# Patient Record
Sex: Male | Born: 1940
Health system: Southern US, Community
[De-identification: ages and names within clinical notes are randomized; demographics above are authoritative.]

## PROBLEM LIST (undated history)

## (undated) DIAGNOSIS — K635 Polyp of colon: Secondary | ICD-10-CM

## (undated) DIAGNOSIS — H269 Unspecified cataract: Secondary | ICD-10-CM

## (undated) DIAGNOSIS — E875 Hyperkalemia: Secondary | ICD-10-CM

## (undated) DIAGNOSIS — E785 Hyperlipidemia, unspecified: Secondary | ICD-10-CM

## (undated) DIAGNOSIS — H9312 Tinnitus, left ear: Secondary | ICD-10-CM

## (undated) DIAGNOSIS — I10 Essential (primary) hypertension: Secondary | ICD-10-CM

## (undated) DIAGNOSIS — H903 Sensorineural hearing loss, bilateral: Secondary | ICD-10-CM

## (undated) DIAGNOSIS — H409 Unspecified glaucoma: Secondary | ICD-10-CM

## (undated) DIAGNOSIS — C4490 Unspecified malignant neoplasm of skin, unspecified: Secondary | ICD-10-CM

## (undated) DIAGNOSIS — R972 Elevated prostate specific antigen [PSA]: Secondary | ICD-10-CM

## (undated) HISTORY — DX: Hyperkalemia: E87.5

## (undated) HISTORY — DX: Unspecified cataract: H26.9

## (undated) HISTORY — DX: Sensorineural hearing loss, bilateral: H90.3

## (undated) HISTORY — PX: COLONOSCOPY: SHX174

## (undated) HISTORY — DX: Unspecified glaucoma: H40.9

## (undated) HISTORY — DX: Essential (primary) hypertension: I10

## (undated) HISTORY — DX: Unspecified malignant neoplasm of skin, unspecified: C44.90

## (undated) HISTORY — DX: Hyperlipidemia, unspecified: E78.5

## (undated) HISTORY — DX: Polyp of colon: K63.5

## (undated) HISTORY — PX: TONSILLECTOMY: SUR1361

## (undated) HISTORY — DX: Elevated prostate specific antigen (PSA): R97.20

## (undated) HISTORY — DX: Tinnitus, left ear: H93.12

---

## 1999-11-25 HISTORY — PX: INGUINAL HERNIA REPAIR: SUR1180

## 2007-11-25 DIAGNOSIS — C4490 Unspecified malignant neoplasm of skin, unspecified: Secondary | ICD-10-CM

## 2007-11-25 HISTORY — DX: Unspecified malignant neoplasm of skin, unspecified: C44.90

## 2009-06-24 HISTORY — PX: COLONOSCOPY W/ POLYPECTOMY: SHX1380

## 2013-10-31 ENCOUNTER — Ambulatory Visit (INDEPENDENT_AMBULATORY_CARE_PROVIDER_SITE_OTHER): Payer: BC Managed Care – PPO | Admitting: Family Medicine

## 2013-10-31 ENCOUNTER — Encounter: Payer: Self-pay | Admitting: Family Medicine

## 2013-10-31 VITALS — BP 158/78 | HR 64 | Temp 98.0°F | Resp 18 | Ht 69.5 in | Wt 177.0 lb

## 2013-10-31 DIAGNOSIS — I10 Essential (primary) hypertension: Secondary | ICD-10-CM

## 2013-10-31 DIAGNOSIS — Z125 Encounter for screening for malignant neoplasm of prostate: Secondary | ICD-10-CM

## 2013-10-31 DIAGNOSIS — E785 Hyperlipidemia, unspecified: Secondary | ICD-10-CM

## 2013-10-31 NOTE — Assessment & Plan Note (Signed)
Most recent lipid panel reviewed from 08/2013 and this was excellent/at goal. We can repeat these in 1 yr.

## 2013-10-31 NOTE — Progress Notes (Signed)
Office Note Pre visit review using our clinic review tool, if applicable. No additional management support is needed unless otherwise documented below in the visit note.  10/31/2013  CC:  Chief Complaint  Patient presents with  . Establish Care  . Hypertension    HPI:  Curtis Matthews is a 72 y.o. White male who is here to establish care. Patient's most recent primary MD: Dr. Christell Constant at [facility]. Old records were (labs from 09/02/12 and 02/2013) reviewed prior to or during today's visit.  Says systolic consistently 140-150s lately per his home checks.  No change in caffeine intake (3 cups/day), no OTC decongestants.  Wt is up a little and exercise down a little lately ([employer]). He plans on working harder on TLC in the coming months b/c he wants to avoid having to add to/change bp med regimen.   Denies HAs, vision changes, palpitations, SOB, or CP.  Past Medical History  Diagnosis Date  . Nonmelanoma skin cancer 2009    sees skin MD annually  . Hyperlipidemia   . Hypertension   . Colon polyps     Past Surgical History  Procedure Laterality Date  . Inguinal hernia repair Left 2001  . Tonsillectomy    . Colonoscopy w/ polypectomy  06/2009    adenomatous polyps; recall 06/2014 ([facility] [place]    Family History  Problem Relation Age of Onset  . Heart disease Mother   . Hypertension Mother     History   Social History  . Marital Status: Married    Spouse Name: N/A    Number of Children: N/A  . Years of Education: N/A   Occupational History  . Not on file.   Social History Main Topics  . Smoking status: Never Smoker   . Smokeless tobacco: Never Used  . Alcohol Use: 0.6 oz/week    1 Glasses of wine per week     Comment: glass of wine daily  . Drug Use: No  . Sexual Activity: Not on file   Other Topics Concern  . Not on file   Social History Narrative   Married, has one adopted son.   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   Never smoker, alcohol--1-2 glasses of wine per day.     No hx of alc abuse or drug use.    Outpatient Encounter Prescriptions as of 10/31/2013  Medication Sig  . aspirin 81 MG tablet Take 81 mg by mouth daily.  . cholecalciferol (VITAMIN D) 1000 UNITS tablet Take 1,000 Units by mouth daily.  . CRESTOR 5 MG tablet Take 5 mg by mouth daily.  Marland Kitchen lisinopril (PRINIVIL,ZESTRIL) 10 MG tablet Take 10 mg by mouth daily.  . Omega-3 Fatty Acids (FISH OIL) 1000 MG CAPS Take by mouth.  . verapamil (VERELAN PM) 240 MG 24 hr capsule Take 240 mg by mouth daily.    No Known Allergies  ROS Review of Systems  Constitutional: Negative for fever and fatigue.  HENT: Negative for congestion and sore throat.   Eyes: Negative for visual disturbance.  Respiratory: Negative for cough.   Cardiovascular: Negative for chest pain.  Gastrointestinal: Negative for nausea and abdominal pain.  Genitourinary: Negative for dysuria.  Musculoskeletal: Negative for back pain and joint swelling.  Skin: Negative for rash.  Neurological: Negative for weakness and headaches.  Hematological: Negative for adenopathy.     PE; Blood pressure 158/78, pulse 64, temperature 98 F (36.7 C), temperature  source Temporal, resp. rate 18, height 5' 9.5" (1.765 m), weight 177 lb (80.287 kg), SpO2 99.00%. Gen: Alert, well appearing.  Patient is oriented to person, place, time, and situation. WUJ:WJXB: no injection, icteris, swelling, or exudate.  EOMI, PERRLA. Mouth: lips without lesion/swelling.  Oral mucosa pink and moist. Oropharynx without erythema, exudate, or swelling.  Neck - No masses or thyromegaly or limitation in range of motion CV: RRR, no m/r/g.   LUNGS: CTA bilat, nonlabored resps, good aeration in all lung fields. EXT: no clubbing, cyanosis, or edema.   Pertinent labs:  None today  08/2013 labs brought in by pt today showed a normal CMET, lipid panel, and UA.   Also PSA 02/2013 was  1.1.  ASSESSMENT AND PLAN:   New pt: obtain old records.  HTN (hypertension), benign Discussed close home monitoring and DASH diet. No med changes made today. We'll review his numbers at his CPE in 3 mo.  Hyperlipidemia Most recent lipid panel reviewed from 08/2013 and this was excellent/at goal. We can repeat these in 1 yr.   An After Visit Summary was printed and given to the patient.  Return in about 3 months (around 01/29/2014) for CPE with fasting labs the week prior.

## 2013-10-31 NOTE — Assessment & Plan Note (Signed)
Discussed close home monitoring and DASH diet. No med changes made today. We'll review his numbers at his CPE in 3 mo.

## 2014-02-08 ENCOUNTER — Telehealth: Payer: Self-pay

## 2014-02-08 ENCOUNTER — Other Ambulatory Visit (INDEPENDENT_AMBULATORY_CARE_PROVIDER_SITE_OTHER): Payer: BC Managed Care – PPO

## 2014-02-08 DIAGNOSIS — Z0000 Encounter for general adult medical examination without abnormal findings: Secondary | ICD-10-CM

## 2014-02-08 NOTE — Telephone Encounter (Signed)
Pt came in for labs today, he has a CPE next week. I know your health maintenance panel but did you want to add a PSA?

## 2014-02-08 NOTE — Telephone Encounter (Signed)
Yes, pls add medicare PSA.-thx

## 2014-02-10 LAB — LIPID PANEL
Cholesterol: 176 mg/dL (ref 0–200)
HDL: 65 mg/dL (ref >39.00)
LDL Cholesterol: 97 mg/dL (ref 0–99)
Total CHOL/HDL Ratio: 3
Triglycerides: 71 mg/dL (ref 0.0–149.0)
VLDL: 14.2 mg/dL (ref 0.0–40.0)

## 2014-02-10 LAB — CBC WITH DIFFERENTIAL/PLATELET
Basophils Absolute: 0 10*3/uL (ref 0.0–0.1)
Basophils Relative: 0.5 % (ref 0.0–3.0)
Eosinophils Absolute: 0.5 10*3/uL (ref 0.0–0.7)
Eosinophils Relative: 11.1 % — ABNORMAL HIGH (ref 0.0–5.0)
HCT: 41.9 % (ref 39.0–52.0)
Hemoglobin: 14.1 g/dL (ref 13.0–17.0)
Lymphocytes Relative: 38 % (ref 12.0–46.0)
Lymphs Abs: 1.8 10*3/uL (ref 0.7–4.0)
MCHC: 33.6 g/dL (ref 30.0–36.0)
MCV: 95.7 fl (ref 78.0–100.0)
Monocytes Absolute: 0.5 10*3/uL (ref 0.1–1.0)
Monocytes Relative: 11.2 % (ref 3.0–12.0)
Neutro Abs: 1.8 10*3/uL (ref 1.4–7.7)
Neutrophils Relative %: 39.2 % — ABNORMAL LOW (ref 43.0–77.0)
Platelets: 206 10*3/uL (ref 150.0–400.0)
RBC: 4.38 Mil/uL (ref 4.22–5.81)
RDW: 13.6 % (ref 11.5–14.6)
WBC: 4.6 10*3/uL (ref 4.5–10.5)

## 2014-02-10 LAB — TSH: TSH: 2.02 u[IU]/mL (ref 0.35–5.50)

## 2014-02-10 LAB — COMPREHENSIVE METABOLIC PANEL
ALT: 17 U/L (ref 0–53)
AST: 17 U/L (ref 0–37)
Albumin: 3.9 g/dL (ref 3.5–5.2)
Alkaline Phosphatase: 57 U/L (ref 39–117)
BUN: 24 mg/dL — ABNORMAL HIGH (ref 6–23)
CO2: 31 mEq/L (ref 19–32)
Calcium: 9.2 mg/dL (ref 8.4–10.5)
Chloride: 104 mEq/L (ref 96–112)
Creatinine, Ser: 1 mg/dL (ref 0.4–1.5)
GFR: 75.77 mL/min (ref >60.00)
Glucose, Bld: 107 mg/dL — ABNORMAL HIGH (ref 70–99)
Potassium: 5 mEq/L (ref 3.5–5.1)
Sodium: 138 mEq/L (ref 135–145)
Total Bilirubin: 0.7 mg/dL (ref 0.3–1.2)
Total Protein: 6.2 g/dL (ref 6.0–8.3)

## 2014-02-10 LAB — PSA, MEDICARE: PSA: 1.36 ng/ml (ref 0.10–4.00)

## 2014-02-15 ENCOUNTER — Encounter: Payer: Self-pay | Admitting: Family Medicine

## 2014-02-15 ENCOUNTER — Ambulatory Visit (INDEPENDENT_AMBULATORY_CARE_PROVIDER_SITE_OTHER): Payer: BC Managed Care – PPO | Admitting: Family Medicine

## 2014-02-15 VITALS — BP 148/78 | HR 70 | Temp 98.2°F | Resp 16 | Ht 69.5 in | Wt 177.0 lb

## 2014-02-15 DIAGNOSIS — E785 Hyperlipidemia, unspecified: Secondary | ICD-10-CM

## 2014-02-15 DIAGNOSIS — D126 Benign neoplasm of colon, unspecified: Secondary | ICD-10-CM

## 2014-02-15 DIAGNOSIS — I10 Essential (primary) hypertension: Secondary | ICD-10-CM

## 2014-02-15 DIAGNOSIS — Z125 Encounter for screening for malignant neoplasm of prostate: Secondary | ICD-10-CM

## 2014-02-15 DIAGNOSIS — Z23 Encounter for immunization: Secondary | ICD-10-CM

## 2014-02-15 DIAGNOSIS — Z0000 Encounter for general adult medical examination without abnormal findings: Secondary | ICD-10-CM

## 2014-02-15 DIAGNOSIS — K635 Polyp of colon: Secondary | ICD-10-CM | POA: Insufficient documentation

## 2014-02-15 MED ORDER — ROSUVASTATIN CALCIUM 5 MG PO TABS: 5.0000 mg | ORAL_TABLET | Freq: Every day | ORAL | Status: DC

## 2014-02-15 MED ORDER — LISINOPRIL 10 MG PO TABS: 10.0000 mg | ORAL_TABLET | Freq: Every day | ORAL | Status: DC

## 2014-02-15 MED ORDER — VERAPAMIL HCL ER 240 MG PO CP24: 240.0000 mg | ORAL_CAPSULE | Freq: Every day | ORAL | Status: DC

## 2014-02-15 NOTE — Progress Notes (Signed)
Pre visit review using our clinic review tool, if applicable. No additional management support is needed unless otherwise documented below in the visit note. 

## 2014-02-15 NOTE — Assessment & Plan Note (Signed)
The current medical regimen is effective;  continue present plan and medications.  

## 2014-02-15 NOTE — Assessment & Plan Note (Signed)
Prevnar 13 today

## 2014-02-15 NOTE — Assessment & Plan Note (Signed)
The current medical regimen is effective;  continue present plan and medications. Continue home bp monitoring. Signs/symptoms to call or return for were reviewed and pt expressed understanding.

## 2014-02-15 NOTE — Assessment & Plan Note (Signed)
Due for repeat colonoscopy via [facility] approx 06/2014.

## 2014-02-15 NOTE — Assessment & Plan Note (Signed)
DRE normal today. Recent PSA was good.

## 2014-02-15 NOTE — Progress Notes (Signed)
Office Note 02/15/2014  CC:  Chief Complaint  Patient presents with  . Annual Exam    HPI:  Curtis Matthews is a 73 y.o. White male who is here for CPE, f/u HTN/hyperlipidemia.   BP checks at home mostly 140s over 70s.  Occ syst in 120s or a little less. Eye check-up recently--fine. Dental recently-fine.  No signif hearing c/o.  Reviewed recent labs in detail with pt today.  Only abnormality was glucose 108: discussed diet/exercise today.   Past Medical History  Diagnosis Date  . Nonmelanoma skin cancer 2009    sees skin MD annually  . Hyperlipidemia   . Hypertension   . Colon polyps     Past Surgical History  Procedure Laterality Date  . Inguinal hernia repair Left 2001  . Tonsillectomy    . Colonoscopy w/ polypectomy  06/2009    adenomatous polyps; recall 06/2014 ([facility] [place])    Family History  Problem Relation Age of Onset  . Heart disease Mother   . Hypertension Mother     History   Social History  . Marital Status: Married    Spouse Name: N/A    Number of Children: N/A  . Years of Education: N/A   Occupational History  . Not on file.   Social History Main Topics  . Smoking status: Never Smoker   . Smokeless tobacco: Never Used  . Alcohol Use: 0.6 oz/week    1 Glasses of wine per week     Comment: glass of wine daily  . Drug Use: No  . Sexual Activity: Not on file   Other Topics Concern  . Not on file   Social History Narrative   Married, has one adopted son.   Occupation:  Retired Chief Executive Officer for [employer].   Never smoker, alcohol--1-2 glasses of wine per day.     No hx of alc abuse or drug use.    Outpatient Prescriptions Prior to Visit  Medication Sig Dispense Refill  . aspirin 81 MG tablet Take 81 mg by mouth daily.      . cholecalciferol (VITAMIN D) 1000 UNITS tablet Take 1,000 Units by mouth daily.      . Omega-3 Fatty Acids (FISH OIL) 1000 MG CAPS Take by mouth.      . CRESTOR 5 MG tablet Take  5 mg by mouth daily.      Marland Kitchen lisinopril (PRINIVIL,ZESTRIL) 10 MG tablet Take 10 mg by mouth daily.      . verapamil (VERELAN PM) 240 MG 24 hr capsule Take 240 mg by mouth daily.       No facility-administered medications prior to visit.    No Known Allergies  ROS Review of Systems  Constitutional: Negative for fever, chills, appetite change and fatigue.  HENT: Negative for congestion, dental problem, ear pain and sore throat.   Eyes: Negative for discharge, redness and visual disturbance.  Respiratory: Negative for cough, chest tightness, shortness of breath and wheezing.   Cardiovascular: Negative for chest pain, palpitations and leg swelling.  Gastrointestinal: Negative for nausea, vomiting, abdominal pain, diarrhea and blood in stool.  Genitourinary: Negative for dysuria, urgency, frequency, hematuria, flank pain and difficulty urinating.  Musculoskeletal: Negative for arthralgias, back pain, joint swelling, myalgias and neck stiffness.  Skin: Negative for pallor and rash.  Neurological: Negative for dizziness, speech difficulty, weakness and headaches.  Hematological: Negative for adenopathy. Does not bruise/bleed easily.  Psychiatric/Behavioral: Negative for confusion and sleep disturbance. The patient is not  nervous/anxious.      PE; Blood pressure 148/78, pulse 70, temperature 98.2 F (36.8 C), temperature source Temporal, resp. rate 16, height 5' 9.5" (1.765 m), weight 177 lb (80.287 kg), SpO2 99.00%. Gen: Alert, well appearing.  Patient is oriented to person, place, time, and situation. AFFECT: pleasant, lucid thought and speech. ENT: Ears: EACs clear, normal epithelium.  TMs with good light reflex and landmarks bilaterally.  Eyes: no injection, icteris, swelling, or exudate.  EOMI, PERRLA. Nose: no drainage or turbinate edema/swelling.  No injection or focal lesion.  Mouth: lips without lesion/swelling.  Oral mucosa pink and moist.  Dentition intact and without obvious caries  or gingival swelling.  Oropharynx without erythema, exudate, or swelling.  Neck: supple/nontender.  No LAD, mass, or TM.  Carotid pulses 2+ bilaterally, without bruits. CV: RRR, no m/r/g.   LUNGS: CTA bilat, nonlabored resps, good aeration in all lung fields. ABD: soft, NT, ND, BS normal.  No hepatospenomegaly or mass.  No bruits. EXT: no clubbing, cyanosis, or edema.  Musculoskeletal: no joint swelling, erythema, warmth, or tenderness.  ROM of all joints intact. Skin - no sores or suspicious lesions or rashes or color changes Rectal exam: negative without mass, lesions or tenderness, PROSTATE EXAM: smooth and symmetric without nodules or tenderness.  Pertinent labs:  Lab Results  Component Value Date   TSH 2.02 02/09/2014   Lab Results  Component Value Date   WBC 4.6 02/09/2014   HGB 14.1 02/09/2014   HCT 41.9 02/09/2014   MCV 95.7 02/09/2014   PLT 206.0 02/09/2014   Lab Results  Component Value Date   CREATININE 1.0 02/09/2014   BUN 24* 02/09/2014   NA 138 02/09/2014   K 5.0 02/09/2014   CL 104 02/09/2014   CO2 31 02/09/2014   Lab Results  Component Value Date   ALT 17 02/09/2014   AST 17 02/09/2014   ALKPHOS 57 02/09/2014   BILITOT 0.7 02/09/2014   Lab Results  Component Value Date   CHOL 176 02/09/2014   Lab Results  Component Value Date   HDL 65.00 02/09/2014   Lab Results  Component Value Date   LDLCALC 97 02/09/2014   Lab Results  Component Value Date   TRIG 71.0 02/09/2014   Lab Results  Component Value Date   CHOLHDL 3 02/09/2014   Lab Results  Component Value Date   PSA 1.36 02/09/2014    ASSESSMENT AND PLAN:   HTN (hypertension), benign The current medical regimen is effective;  continue present plan and medications. Continue home bp monitoring. Signs/symptoms to call or return for were reviewed and pt expressed understanding.   Hyperlipidemia The current medical regimen is effective;  continue present plan and medications.  Prostate cancer  screening DRE normal today. Recent PSA was good.  Colon polyps Due for repeat colonoscopy via [facility] approx 06/2014.  Preventative health care Prevnar 13 today.   An After Visit Summary was printed and given to the patient.  FOLLOW UP:  Return in about 6 months (around 08/18/2014) for routine chronic illness f/u.

## 2014-02-16 ENCOUNTER — Telehealth: Payer: Self-pay | Admitting: Family Medicine

## 2014-02-16 NOTE — Telephone Encounter (Signed)
Relevant patient education assigned to patient using Emmi. ° °

## 2014-08-16 ENCOUNTER — Ambulatory Visit: Payer: BC Managed Care – PPO | Admitting: Family Medicine

## 2014-08-23 ENCOUNTER — Ambulatory Visit (INDEPENDENT_AMBULATORY_CARE_PROVIDER_SITE_OTHER): Payer: BC Managed Care – PPO | Admitting: Family Medicine

## 2014-08-23 ENCOUNTER — Encounter: Payer: Self-pay | Admitting: Family Medicine

## 2014-08-23 VITALS — BP 124/77 | HR 65 | Temp 98.2°F | Resp 16 | Ht 69.5 in | Wt 175.0 lb

## 2014-08-23 DIAGNOSIS — S46819A Strain of other muscles, fascia and tendons at shoulder and upper arm level, unspecified arm, initial encounter: Secondary | ICD-10-CM

## 2014-08-23 DIAGNOSIS — D126 Benign neoplasm of colon, unspecified: Secondary | ICD-10-CM

## 2014-08-23 DIAGNOSIS — S43499A Other sprain of unspecified shoulder joint, initial encounter: Secondary | ICD-10-CM

## 2014-08-23 DIAGNOSIS — K635 Polyp of colon: Secondary | ICD-10-CM

## 2014-08-23 DIAGNOSIS — I10 Essential (primary) hypertension: Secondary | ICD-10-CM

## 2014-08-23 DIAGNOSIS — E785 Hyperlipidemia, unspecified: Secondary | ICD-10-CM

## 2014-08-23 DIAGNOSIS — Z0000 Encounter for general adult medical examination without abnormal findings: Secondary | ICD-10-CM

## 2014-08-23 DIAGNOSIS — S46312A Strain of muscle, fascia and tendon of triceps, left arm, initial encounter: Secondary | ICD-10-CM

## 2014-08-23 DIAGNOSIS — Z23 Encounter for immunization: Secondary | ICD-10-CM

## 2014-08-23 NOTE — Progress Notes (Addendum)
OFFICE NOTE  08/29/2014  CC:  Chief Complaint  Patient presents with  . Follow-up   HPI: Patient is a 73 y.o. Caucasian male who is here for 6 mo follow up HTN, hyperlipidemia.   Home bp monitoring avg high 130s over 70s.  Compliant with chronic meds, no side effects. Has colonoscopy to f/u hx of adenomas scheduled for 09/04/2014.  Says he strained his left triceps muscle doing yard work about 3 mo ago, took NSAIDs the first week after, has been doing some stretches and lightened up his workouts with that arm--is now about 80 % improved.     Pertinent PMH:  Past medical, surgical, social, and family history reviewed and no changes are noted since last office visit.  MEDS:  Outpatient Prescriptions Prior to Visit  Medication Sig Dispense Refill  . aspirin 81 MG tablet Take 81 mg by mouth daily.      . cholecalciferol (VITAMIN D) 1000 UNITS tablet Take 1,000 Units by mouth daily.      Marland Kitchen lisinopril (PRINIVIL,ZESTRIL) 10 MG tablet Take 1 tablet (10 mg total) by mouth daily.  90 tablet  3  . Omega-3 Fatty Acids (FISH OIL) 1000 MG CAPS Take by mouth.      . rosuvastatin (CRESTOR) 5 MG tablet Take 1 tablet (5 mg total) by mouth daily.  90 tablet  3  . verapamil (VERELAN PM) 240 MG 24 hr capsule Take 1 capsule (240 mg total) by mouth daily.  90 capsule  3   No facility-administered medications prior to visit.    PE: Blood pressure 124/77, pulse 65, temperature 98.2 F (36.8 C), temperature source Temporal, resp. rate 16, height 5' 9.5" (1.765 m), weight 175 lb (79.379 kg), SpO2 98.00%. Gen: Alert, well appearing.  Patient is oriented to person, place, time, and situation. CV: RRR, no m/r/g.   LUNGS: CTA bilat, nonlabored resps, good aeration in all lung fields. Left shoulder ROM fully intact, without tenderness to palpation. Upper arm soft tissues nontender.  Upper arm strength 5/5 in flexion and extension.  LABS:    Chemistry      Component Value Date/Time   NA 138 02/09/2014  0820   K 5.0 02/09/2014 0820   CL 104 02/09/2014 0820   CO2 31 02/09/2014 0820   BUN 24* 02/09/2014 0820   CREATININE 1.0 02/09/2014 0820      Component Value Date/Time   CALCIUM 9.2 02/09/2014 0820   ALKPHOS 57 02/09/2014 0820   AST 17 02/09/2014 0820   ALT 17 02/09/2014 0820   BILITOT 0.7 02/09/2014 0820     Lab Results  Component Value Date   CHOL 176 02/09/2014   HDL 65.00 02/09/2014   LDLCALC 97 02/09/2014   TRIG 71.0 02/09/2014   CHOLHDL 3 02/09/2014   Lab Results  Component Value Date   WBC 4.6 02/09/2014   HGB 14.1 02/09/2014   HCT 41.9 02/09/2014   MCV 95.7 02/09/2014   PLT 206.0 02/09/2014   Lab Results  Component Value Date   TSH 2.02 02/09/2014   IMPRESSION AND PLAN:  1) HTN; The current medical regimen is effective;  continue present plan and medications.  2) Hyperlipidemia; The current medical regimen is effective;  continue present plan and medications.  3) Hx of colon polyps: repeat colonoscopy scheduled.  4) Left triceps muscle strain; resolving gradually.  Continue with relative rest, NSAID prn.  5) Preventative health care: flu vaccine IM today.  An After Visit Summary was printed and given to the  patient.  FOLLOW UP: 6 mo CPE with fasting labs the week prior

## 2014-08-23 NOTE — Progress Notes (Signed)
Pre visit review using our clinic review tool, if applicable. No additional management support is needed unless otherwise documented below in the visit note. 

## 2014-09-04 ENCOUNTER — Encounter: Payer: Self-pay | Admitting: Family Medicine

## 2015-02-20 ENCOUNTER — Other Ambulatory Visit (INDEPENDENT_AMBULATORY_CARE_PROVIDER_SITE_OTHER): Payer: BLUE CROSS/BLUE SHIELD

## 2015-02-20 ENCOUNTER — Other Ambulatory Visit: Payer: Self-pay | Admitting: Family Medicine

## 2015-02-20 DIAGNOSIS — Z125 Encounter for screening for malignant neoplasm of prostate: Secondary | ICD-10-CM

## 2015-02-20 DIAGNOSIS — Z0000 Encounter for general adult medical examination without abnormal findings: Secondary | ICD-10-CM | POA: Diagnosis not present

## 2015-02-20 LAB — CBC WITH DIFFERENTIAL/PLATELET
Basophils Absolute: 0 10*3/uL (ref 0.0–0.1)
Basophils Relative: 0.3 % (ref 0.0–3.0)
Eosinophils Absolute: 0.4 10*3/uL (ref 0.0–0.7)
Eosinophils Relative: 6.4 % — ABNORMAL HIGH (ref 0.0–5.0)
HCT: 43.9 % (ref 39.0–52.0)
Hemoglobin: 14.8 g/dL (ref 13.0–17.0)
Lymphocytes Relative: 27.1 % (ref 12.0–46.0)
Lymphs Abs: 1.5 10*3/uL (ref 0.7–4.0)
MCHC: 33.6 g/dL (ref 30.0–36.0)
MCV: 94.7 fl (ref 78.0–100.0)
Monocytes Absolute: 0.5 10*3/uL (ref 0.1–1.0)
Monocytes Relative: 8.7 % (ref 3.0–12.0)
Neutro Abs: 3.2 10*3/uL (ref 1.4–7.7)
Neutrophils Relative %: 57.5 % (ref 43.0–77.0)
Platelets: 239 10*3/uL (ref 150.0–400.0)
RBC: 4.64 Mil/uL (ref 4.22–5.81)
RDW: 13.7 % (ref 11.5–15.5)
WBC: 5.6 10*3/uL (ref 4.0–10.5)

## 2015-02-20 LAB — COMPREHENSIVE METABOLIC PANEL
ALT: 15 U/L (ref 0–53)
AST: 17 U/L (ref 0–37)
Albumin: 4 g/dL (ref 3.5–5.2)
Alkaline Phosphatase: 61 U/L (ref 39–117)
BUN: 15 mg/dL (ref 6–23)
CO2: 31 mEq/L (ref 19–32)
Calcium: 9.3 mg/dL (ref 8.4–10.5)
Chloride: 105 mEq/L (ref 96–112)
Creatinine, Ser: 0.97 mg/dL (ref 0.40–1.50)
GFR: 80.07 mL/min (ref >60.00)
Glucose, Bld: 93 mg/dL (ref 70–99)
Potassium: 5 mEq/L (ref 3.5–5.1)
Sodium: 138 mEq/L (ref 135–145)
Total Bilirubin: 0.4 mg/dL (ref 0.2–1.2)
Total Protein: 6.4 g/dL (ref 6.0–8.3)

## 2015-02-20 LAB — LIPID PANEL
Cholesterol: 184 mg/dL (ref 0–200)
HDL: 63.4 mg/dL (ref >39.00)
LDL Cholesterol: 107 mg/dL — ABNORMAL HIGH (ref 0–99)
NonHDL: 120.6
Total CHOL/HDL Ratio: 3
Triglycerides: 68 mg/dL (ref 0.0–149.0)
VLDL: 13.6 mg/dL (ref 0.0–40.0)

## 2015-02-20 LAB — PSA: PSA: 1.56 ng/mL (ref 0.10–4.00)

## 2015-02-20 LAB — TSH: TSH: 3.48 u[IU]/mL (ref 0.35–4.50)

## 2015-02-23 ENCOUNTER — Encounter: Payer: Self-pay | Admitting: Family Medicine

## 2015-02-23 ENCOUNTER — Ambulatory Visit (INDEPENDENT_AMBULATORY_CARE_PROVIDER_SITE_OTHER): Payer: BLUE CROSS/BLUE SHIELD | Admitting: Family Medicine

## 2015-02-23 VITALS — BP 106/60 | HR 60 | Temp 98.0°F | Ht 69.5 in | Wt 175.0 lb

## 2015-02-23 DIAGNOSIS — Z0000 Encounter for general adult medical examination without abnormal findings: Secondary | ICD-10-CM

## 2015-02-23 MED ORDER — LISINOPRIL 10 MG PO TABS: 10.0000 mg | ORAL_TABLET | Freq: Every day | ORAL | Status: DC

## 2015-02-23 MED ORDER — VERAPAMIL HCL ER 240 MG PO CP24: 240.0000 mg | ORAL_CAPSULE | Freq: Every day | ORAL | Status: DC

## 2015-02-23 MED ORDER — ROSUVASTATIN CALCIUM 5 MG PO TABS
5.0000 mg | ORAL_TABLET | Freq: Every day | ORAL | Status: DC
Start: 2015-02-23 — End: 2016-02-26

## 2015-02-23 NOTE — Progress Notes (Signed)
Office Note 02/23/2015  CC:  Chief Complaint  Patient presents with  . Annual Exam    HPI:  Curtis Matthews is a 74 y.o. White male who is here for CPE. Reviewed all recent labs in detail with pt today (see below, all wnl).  Last eye exam 10/2014: no signif abnormalities. Keeping up with dental exams well. Hearing: gradual bilat diminished hearing is percieved, L>R with slight ringing in L ear. Conversation is fine but some tones difficult.  He is not ready to see audiologist yet at this point.  No hx of excessive noise exposure.  Past Medical History  Diagnosis Date  . Nonmelanoma skin cancer 2009    sees skin MD annually  . Hyperlipidemia   . Hypertension   . Colon polyps     Repeat TCS on 09/04/2014 showed no polyps and GI MD recommended NO FURTHER COLONOSCOPIES    Past Surgical History  Procedure Laterality Date  . Inguinal hernia repair Left 2001  . Tonsillectomy    . Colonoscopy w/ polypectomy  06/2009    adenomatous polyps; recall 06/2014 ([facility] [place])  . Colonoscopy  10/12/12015    mild diverticulosis.  No polyps.  No further screening colonoscopies recommended ([facility])    Family History  Problem Relation Age of Onset  . Heart disease Mother   . Hypertension Mother     History   Social History  . Marital Status: Married    Spouse Name: N/A  . Number of Children: N/A  . Years of Education: N/A   Occupational History  . Not on file.   Social History Main Topics  . Smoking status: Never Smoker   . Smokeless tobacco: Never Used  . Alcohol Use: 0.6 oz/week    1 Glasses of wine per week     Comment: glass of wine daily  . Drug Use: No  . Sexual Activity: Not on file   Other Topics Concern  . Not on file   Social History Narrative   Married, has one adopted son.   Occupation:  Retired Chief Executive Officer for [employer].   Never smoker, alcohol--1-2 glasses of wine per day.     No hx of alc abuse or  drug use.    Outpatient Prescriptions Prior to Visit  Medication Sig Dispense Refill  . aspirin 81 MG tablet Take 81 mg by mouth daily.    . cholecalciferol (VITAMIN D) 1000 UNITS tablet Take 1,000 Units by mouth daily.    Marland Kitchen lisinopril (PRINIVIL,ZESTRIL) 10 MG tablet Take 1 tablet (10 mg total) by mouth daily. 90 tablet 3  . Omega-3 Fatty Acids (FISH OIL) 1000 MG CAPS Take by mouth.    . rosuvastatin (CRESTOR) 5 MG tablet Take 1 tablet (5 mg total) by mouth daily. 90 tablet 3  . verapamil (VERELAN PM) 240 MG 24 hr capsule Take 1 capsule (240 mg total) by mouth daily. 90 capsule 3   No facility-administered medications prior to visit.    No Known Allergies  ROS Review of Systems  Constitutional: Negative for fever, chills, appetite change and fatigue.  HENT: Negative for congestion, dental problem, ear pain and sore throat.   Eyes: Negative for discharge, redness and visual disturbance.  Respiratory: Negative for cough, chest tightness, shortness of breath and wheezing.   Cardiovascular: Negative for chest pain, palpitations and leg swelling.  Gastrointestinal: Negative for nausea, vomiting, abdominal pain, diarrhea and blood in stool.  Genitourinary: Negative for dysuria, urgency, frequency, hematuria,  flank pain and difficulty urinating.  Musculoskeletal: Negative for myalgias, back pain, joint swelling, arthralgias and neck stiffness.  Skin: Negative for pallor and rash.  Neurological: Negative for dizziness, speech difficulty, weakness and headaches.  Hematological: Negative for adenopathy. Does not bruise/bleed easily.  Psychiatric/Behavioral: Negative for confusion and sleep disturbance. The patient is not nervous/anxious.     PE; Blood pressure 106/60, pulse 60, temperature 98 F (36.7 C), temperature source Oral, height 5' 9.5" (1.765 m), weight 175 lb (79.379 kg), SpO2 97 %. Gen: Alert, well appearing.  Patient is oriented to person, place, time, and situation. AFFECT:  pleasant, lucid thought and speech. ENT: Ears: EACs clear, normal epithelium.  TMs with good light reflex and landmarks bilaterally.  Eyes: no injection, icteris, swelling, or exudate.  EOMI, PERRLA. Nose: no drainage or turbinate edema/swelling.  No injection or focal lesion.  Mouth: lips without lesion/swelling.  Oral mucosa pink and moist.  Dentition intact and without obvious caries or gingival swelling.  Oropharynx without erythema, exudate, or swelling.  Neck: supple/nontender.  No LAD, mass, or TM.  Carotid pulses 2+ bilaterally, without bruits. CV: RRR, no m/r/g.   LUNGS: CTA bilat, nonlabored resps, good aeration in all lung fields. ABD: soft, NT, ND, BS normal.  No hepatospenomegaly or mass.  No bruits. EXT: no clubbing, cyanosis, or edema. negative without mass, lesions or tendernessMusculoskeletal: no joint swelling, erythema, warmth, or tenderness.  ROM of all joints intact. Skin - no sores or suspicious lesions or rashes or color changes Rectal exam: Anal verge has multiple skin tags, otherwise rectal exam is negative without mass, lesions or tenderness.  Prostate without enlargement, nodule, asymmetry, or tenderness.  Pertinent labs:  Lab Results  Component Value Date   TSH 3.48 02/20/2015   Lab Results  Component Value Date   WBC 5.6 02/20/2015   HGB 14.8 02/20/2015   HCT 43.9 02/20/2015   MCV 94.7 02/20/2015   PLT 239.0 02/20/2015   Lab Results  Component Value Date   CREATININE 0.97 02/20/2015   BUN 15 02/20/2015   NA 138 02/20/2015   K 5.0 02/20/2015   CL 105 02/20/2015   CO2 31 02/20/2015   Lab Results  Component Value Date   ALT 15 02/20/2015   AST 17 02/20/2015   ALKPHOS 61 02/20/2015   BILITOT 0.4 02/20/2015   Lab Results  Component Value Date   CHOL 184 02/20/2015   Lab Results  Component Value Date   HDL 63.40 02/20/2015   Lab Results  Component Value Date   LDLCALC 107* 02/20/2015   Lab Results  Component Value Date   TRIG 68.0  02/20/2015   Lab Results  Component Value Date   CHOLHDL 3 02/20/2015   Lab Results  Component Value Date   PSA 1.56 02/20/2015   PSA 1.36 02/09/2014   ASSESSMENT AND PLAN:   Health maintenance examination Reviewed age and gender appropriate health maintenance issues (prudent diet, regular exercise, health risks of tobacco and excessive alcohol, use of seatbelts, fire alarms in home, use of sunscreen).  Also reviewed age and gender appropriate health screening as well as vaccine recommendations. Vacc's UTD. DRE normal, recent PSA normal.   Colon ca screening UTD--no further colonoscopies recommended per GI.    An After Visit Summary was printed and given to the patient.  FOLLOW UP:  Return in about 6 months (around 08/25/2015) for routine chronic illness f/u.

## 2015-02-23 NOTE — Assessment & Plan Note (Signed)
Reviewed age and gender appropriate health maintenance issues (prudent diet, regular exercise, health risks of tobacco and excessive alcohol, use of seatbelts, fire alarms in home, use of sunscreen).  Also reviewed age and gender appropriate health screening as well as vaccine recommendations. Vacc's UTD. DRE normal, recent PSA normal.   Colon ca screening UTD--no further colonoscopies recommended per GI.

## 2015-02-23 NOTE — Progress Notes (Signed)
Pre visit review using our clinic review tool, if applicable. No additional management support is needed unless otherwise documented below in the visit note. 

## 2015-08-28 ENCOUNTER — Encounter: Payer: Self-pay | Admitting: Family Medicine

## 2015-08-28 ENCOUNTER — Ambulatory Visit (INDEPENDENT_AMBULATORY_CARE_PROVIDER_SITE_OTHER): Payer: BLUE CROSS/BLUE SHIELD | Admitting: Family Medicine

## 2015-08-28 VITALS — BP 130/77 | HR 61 | Temp 97.5°F | Resp 16 | Ht 69.5 in | Wt 173.0 lb

## 2015-08-28 DIAGNOSIS — E785 Hyperlipidemia, unspecified: Secondary | ICD-10-CM | POA: Diagnosis not present

## 2015-08-28 DIAGNOSIS — Z23 Encounter for immunization: Secondary | ICD-10-CM

## 2015-08-28 DIAGNOSIS — I10 Essential (primary) hypertension: Secondary | ICD-10-CM | POA: Diagnosis not present

## 2015-08-28 LAB — BASIC METABOLIC PANEL
BUN: 19 mg/dL (ref 6–23)
CO2: 30 mEq/L (ref 19–32)
Calcium: 9 mg/dL (ref 8.4–10.5)
Chloride: 105 mEq/L (ref 96–112)
Creatinine, Ser: 0.93 mg/dL (ref 0.40–1.50)
GFR: 83.94 mL/min (ref >60.00)
Glucose, Bld: 99 mg/dL (ref 70–99)
Potassium: 5.2 mEq/L — ABNORMAL HIGH (ref 3.5–5.1)
Sodium: 142 mEq/L (ref 135–145)

## 2015-08-28 NOTE — Progress Notes (Signed)
OFFICE VISIT  08/28/2015   CC:  Chief Complaint  Patient presents with  . Follow-up    Pt is fasting.    HPI:    Patient is a 74 y.o. Caucasian male who presents for 6 mo f/u HTN and hyperlipidemia. No complaints. Compliant with meds, no side effects from meds. Not exercising much lately, not monitoring bp at home either.  Past Medical History  Diagnosis Date  . Nonmelanoma skin cancer 2009    sees skin MD annually  . Hyperlipidemia   . Hypertension   . Colon polyps     Repeat TCS on 09/04/2014 showed no polyps and GI MD recommended NO FURTHER COLONOSCOPIES    Past Surgical History  Procedure Laterality Date  . Inguinal hernia repair Left 2001  . Tonsillectomy    . Colonoscopy w/ polypectomy  06/2009    adenomatous polyps; recall [facility] [place]  . Colonoscopy  10/12/12015    mild diverticulosis.  No polyps.  No further screening colonoscopies recommended ([facility])    Outpatient Prescriptions Prior to Visit  Medication Sig Dispense Refill  . aspirin 81 MG tablet Take 81 mg by mouth daily.    . cholecalciferol (VITAMIN D) 1000 UNITS tablet Take 1,000 Units by mouth daily.    Marland Kitchen lisinopril (PRINIVIL,ZESTRIL) 10 MG tablet Take 1 tablet (10 mg total) by mouth daily. 90 tablet 3  . Omega-3 Fatty Acids (FISH OIL) 1000 MG CAPS Take by mouth.    . rosuvastatin (CRESTOR) 5 MG tablet Take 1 tablet (5 mg total) by mouth daily. 90 tablet 3  . verapamil (VERELAN PM) 240 MG 24 hr capsule Take 1 capsule (240 mg total) by mouth daily. 90 capsule 3   No facility-administered medications prior to visit.    No Known Allergies  ROS As per HPI  PE: Blood pressure 130/77, pulse 61, temperature 97.5 F (36.4 C), temperature source Oral, resp. rate 16, height 5' 9.5" (1.765 m), weight 173 lb (78.472 kg), SpO2 99 %. Gen: Alert, well appearing.  Patient is oriented to person, place, time, and situation. CV: RRR, no m/r/g.   LUNGS: CTA  bilat, nonlabored resps, good aeration in all lung fields. EXT: no clubbing, cyanosis, or edema.    LABS:  Lab Results  Component Value Date   TSH 3.48 02/20/2015   Lab Results  Component Value Date   WBC 5.6 02/20/2015   HGB 14.8 02/20/2015   HCT 43.9 02/20/2015   MCV 94.7 02/20/2015   PLT 239.0 02/20/2015   Lab Results  Component Value Date   CREATININE 0.97 02/20/2015   BUN 15 02/20/2015   NA 138 02/20/2015   K 5.0 02/20/2015   CL 105 02/20/2015   CO2 31 02/20/2015   Lab Results  Component Value Date   ALT 15 02/20/2015   AST 17 02/20/2015   ALKPHOS 61 02/20/2015   BILITOT 0.4 02/20/2015   Lab Results  Component Value Date   CHOL 184 02/20/2015   Lab Results  Component Value Date   HDL 63.40 02/20/2015   Lab Results  Component Value Date   LDLCALC 107* 02/20/2015   Lab Results  Component Value Date   TRIG 68.0 02/20/2015   Lab Results  Component Value Date   CHOLHDL 3 02/20/2015   Lab Results  Component Value Date   PSA 1.56 02/20/2015   PSA 1.36 02/09/2014    IMPRESSION AND PLAN:  1) HTN; The current medical regimen is effective;  continue  present plan and medications. BMET today.  2) Hyperlipidemia; tolerating statin. Last lipids 6 mo ago good, as were ALT and AST. Recheck these in 6 mo.  3) Prev health care: Flu vaccine today.  An After Visit Summary was printed and given to the patient.  FOLLOW UP: Return in about 6 months (around 02/26/2016) for annual CPE with fasting labs the week prior.

## 2015-08-28 NOTE — Progress Notes (Signed)
Pre visit review using our clinic review tool, if applicable. No additional management support is needed unless otherwise documented below in the visit note. 

## 2016-02-19 ENCOUNTER — Other Ambulatory Visit (INDEPENDENT_AMBULATORY_CARE_PROVIDER_SITE_OTHER): Payer: BLUE CROSS/BLUE SHIELD

## 2016-02-19 DIAGNOSIS — Z125 Encounter for screening for malignant neoplasm of prostate: Secondary | ICD-10-CM

## 2016-02-19 DIAGNOSIS — Z0000 Encounter for general adult medical examination without abnormal findings: Secondary | ICD-10-CM

## 2016-02-19 LAB — COMPREHENSIVE METABOLIC PANEL
ALT: 16 U/L (ref 0–53)
AST: 17 U/L (ref 0–37)
Albumin: 4 g/dL (ref 3.5–5.2)
Alkaline Phosphatase: 55 U/L (ref 39–117)
BUN: 18 mg/dL (ref 6–23)
CO2: 28 mEq/L (ref 19–32)
Calcium: 9.4 mg/dL (ref 8.4–10.5)
Chloride: 106 mEq/L (ref 96–112)
Creatinine, Ser: 0.98 mg/dL (ref 0.40–1.50)
GFR: 78.92 mL/min (ref >60.00)
Glucose, Bld: 104 mg/dL — ABNORMAL HIGH (ref 70–99)
Potassium: 5.9 mEq/L — ABNORMAL HIGH (ref 3.5–5.1)
Sodium: 140 mEq/L (ref 135–145)
Total Bilirubin: 0.6 mg/dL (ref 0.2–1.2)
Total Protein: 6.2 g/dL (ref 6.0–8.3)

## 2016-02-19 LAB — LIPID PANEL
Cholesterol: 203 mg/dL — ABNORMAL HIGH (ref 0–200)
HDL: 65.2 mg/dL (ref >39.00)
LDL Cholesterol: 114 mg/dL — ABNORMAL HIGH (ref 0–99)
NonHDL: 137.51
Total CHOL/HDL Ratio: 3
Triglycerides: 119 mg/dL (ref 0.0–149.0)
VLDL: 23.8 mg/dL (ref 0.0–40.0)

## 2016-02-19 LAB — PSA: PSA: 1.51 ng/mL (ref 0.10–4.00)

## 2016-02-19 LAB — CBC
HCT: 42.6 % (ref 39.0–52.0)
Hemoglobin: 14.3 g/dL (ref 13.0–17.0)
MCHC: 33.5 g/dL (ref 30.0–36.0)
MCV: 95.5 fl (ref 78.0–100.0)
Platelets: 227 10*3/uL (ref 150.0–400.0)
RBC: 4.46 Mil/uL (ref 4.22–5.81)
RDW: 13.8 % (ref 11.5–15.5)
WBC: 5.7 10*3/uL (ref 4.0–10.5)

## 2016-02-19 LAB — TSH: TSH: 3.14 u[IU]/mL (ref 0.35–4.50)

## 2016-02-22 ENCOUNTER — Encounter: Payer: Self-pay | Admitting: Family Medicine

## 2016-02-26 ENCOUNTER — Encounter: Payer: Self-pay | Admitting: Family Medicine

## 2016-02-26 ENCOUNTER — Ambulatory Visit (INDEPENDENT_AMBULATORY_CARE_PROVIDER_SITE_OTHER): Payer: BLUE CROSS/BLUE SHIELD | Admitting: Family Medicine

## 2016-02-26 VITALS — BP 135/83 | HR 55 | Temp 97.8°F | Resp 16 | Ht 69.0 in | Wt 169.0 lb

## 2016-02-26 DIAGNOSIS — E875 Hyperkalemia: Secondary | ICD-10-CM | POA: Diagnosis not present

## 2016-02-26 DIAGNOSIS — Z0000 Encounter for general adult medical examination without abnormal findings: Secondary | ICD-10-CM

## 2016-02-26 DIAGNOSIS — Z125 Encounter for screening for malignant neoplasm of prostate: Secondary | ICD-10-CM

## 2016-02-26 MED ORDER — VERAPAMIL HCL ER 240 MG PO CP24: 240.0000 mg | ORAL_CAPSULE | Freq: Every day | ORAL | Status: DC

## 2016-02-26 MED ORDER — ROSUVASTATIN CALCIUM 5 MG PO TABS: 5.0000 mg | ORAL_TABLET | Freq: Every day | ORAL | Status: DC

## 2016-02-26 NOTE — Progress Notes (Signed)
Pre visit review using our clinic review tool, if applicable. No additional management support is needed unless otherwise documented below in the visit note. 

## 2016-02-26 NOTE — Progress Notes (Signed)
Office Note 02/26/2016  CC:  Chief Complaint  Patient presents with  . Annual Exam    Pt is fasting. Labs done 02/18/16.     HPI:  Curtis Matthews is a 75 y.o. White male who is here for annual health maintenance exam. Reviewed recent fasting HP labs in detail.  Potassium was 5.9 and pt was not taking any potassium supplements. I cut his ACE-I dose in half and he started this dosing today.  He also changed to a low potassium diet. He has lost a few pounds with this diet already.  Not exercising much but says he's going to restart this in response to his mild climb in LDL over the last few checks.  No acute complaints.  Pt states he wants to discontinue prostate cancer screening after today's check (his PSA was recently normal).  Past Medical History  Diagnosis Date  . Nonmelanoma skin cancer 2009    sees skin MD annually  . Hyperlipidemia   . Hypertension   . Colon polyps     Repeat TCS on 09/04/2014 showed no polyps and GI MD recommended NO FURTHER COLONOSCOPIES  . Hyperkalemia     ? secondary to ACE-I?  Dose decreased 02/22/16    Past Surgical History  Procedure Laterality Date  . Inguinal hernia repair Left 2001  . Tonsillectomy    . Colonoscopy w/ polypectomy  06/2009    adenomatous polyps; recall [facility]/2014 ([facility] [place]  . Colonoscopy  10/12/12015    mild diverticulosis.  No polyps.  No further screening colonoscopies recommended ([facility])    Family History  Problem Relation Age of Onset  . Heart disease Mother   . Hypertension Mother     Social History   Social History  . Marital Status: Married    Spouse Name: N/A  . Number of Children: N/A  . Years of Education: N/A   Occupational History  . Not on file.   Social History Main Topics  . Smoking status: Never Smoker   . Smokeless tobacco: Never Used  . Alcohol Use: 0.6 oz/week    1 Glasses of wine per week     Comment: glass of wine daily  . Drug Use: No  .  Sexual Activity: Not on file   Other Topics Concern  . Not on file   Social History Narrative   Married, has one adopted son.   Occupation:  Retired Chief Executive Officer for [employer].   Never smoker, alcohol--1-2 glasses of wine per day.     No hx of alc abuse or drug use.   Meds: taking crestor 5mg  qod Outpatient Prescriptions Prior to Visit  Medication Sig Dispense Refill  . aspirin 81 MG tablet Take 81 mg by mouth daily.    Marland Kitchen lisinopril (PRINIVIL,ZESTRIL) 10 MG tablet Take 1 tablet (10 mg total) by mouth daily. 90 tablet 3  . Omega-3 Fatty Acids (FISH OIL) 1000 MG CAPS Take by mouth.    . rosuvastatin (CRESTOR) 5 MG tablet Take 1 tablet (5 mg total) by mouth daily. 90 tablet 3  . verapamil (VERELAN PM) 240 MG 24 hr capsule Take 1 capsule (240 mg total) by mouth daily. 90 capsule 3  . cholecalciferol (VITAMIN D) 1000 UNITS tablet Take 1,000 Units by mouth daily. Reported on 02/26/2016     No facility-administered medications prior to visit.    No Known Allergies  ROS Review of Systems  Constitutional: Negative for fever, chills, appetite change and fatigue.  HENT: Negative for congestion, dental problem, ear pain and sore throat.   Eyes: Negative for discharge, redness and visual disturbance.  Respiratory: Negative for cough, chest tightness, shortness of breath and wheezing.   Cardiovascular: Negative for chest pain, palpitations and leg swelling.  Gastrointestinal: Negative for nausea, vomiting, abdominal pain, diarrhea and blood in stool.  Genitourinary: Negative for dysuria, urgency, frequency, hematuria, flank pain and difficulty urinating.  Musculoskeletal: Negative for myalgias, back pain, joint swelling, arthralgias and neck stiffness.  Skin: Negative for pallor and rash.  Neurological: Negative for dizziness, speech difficulty, weakness and headaches.  Hematological: Negative for adenopathy. Does not bruise/bleed easily.  Psychiatric/Behavioral: Negative for  confusion and sleep disturbance. The patient is not nervous/anxious.     PE; Blood pressure 135/83, pulse 55, temperature 97.8 F (36.6 C), temperature source Oral, resp. rate 16, height 5\' 9"  (1.753 m), weight 169 lb (76.658 kg), SpO2 100 %. Gen: Alert, well appearing.  Patient is oriented to person, place, time, and situation. AFFECT: pleasant, lucid thought and speech. ENT: Ears: EACs clear, normal epithelium.  TMs with good light reflex and landmarks bilaterally.  Eyes: no injection, icteris, swelling, or exudate.  EOMI, PERRLA. Nose: no drainage or turbinate edema/swelling.  No injection or focal lesion.  Mouth: lips without lesion/swelling.  Oral mucosa pink and moist.  Dentition intact and without obvious caries or gingival swelling.  Oropharynx without erythema, exudate, or swelling.  Neck: supple/nontender.  No LAD, mass, or TM.  Carotid pulses 2+ bilaterally, without bruits. CV: RRR, no m/r/g.   LUNGS: CTA bilat, nonlabored resps, good aeration in all lung fields. ABD: soft, NT, ND, BS normal.  No hepatospenomegaly or mass.  No bruits. EXT: no clubbing, cyanosis, or edema.  Musculoskeletal: no joint swelling, erythema, warmth, or tenderness.  ROM of all joints intact. Skin - no sores or suspicious lesions or rashes or color changes Rectal exam: negative without mass, lesions or tenderness, PROSTATE EXAM: smooth and symmetric without nodules or tenderness.   Pertinent labs:  Lab Results  Component Value Date   TSH 3.14 02/19/2016   Lab Results  Component Value Date   WBC 5.7 02/19/2016   HGB 14.3 02/19/2016   HCT 42.6 02/19/2016   MCV 95.5 02/19/2016   PLT 227.0 02/19/2016   Lab Results  Component Value Date   CREATININE 0.98 02/19/2016   BUN 18 02/19/2016   NA 140 02/19/2016   K 5.9* 02/19/2016   CL 106 02/19/2016   CO2 28 02/19/2016   Lab Results  Component Value Date   ALT 16 02/19/2016   AST 17 02/19/2016   ALKPHOS 55 02/19/2016   BILITOT 0.6 02/19/2016    Lab Results  Component Value Date   CHOL 203* 02/19/2016   Lab Results  Component Value Date   HDL 65.20 02/19/2016   Lab Results  Component Value Date   LDLCALC 114* 02/19/2016   Lab Results  Component Value Date   TRIG 119.0 02/19/2016   Lab Results  Component Value Date   CHOLHDL 3 02/19/2016   Lab Results  Component Value Date   PSA 1.51 02/19/2016   PSA 1.56 02/20/2015   PSA 1.36 02/09/2014   ASSESSMENT AND PLAN:   Health maintenance exam: Reviewed age and gender appropriate health maintenance issues (prudent diet, regular exercise, health risks of tobacco and excessive alcohol, use of seatbelts, fire alarms in home, use of sunscreen).  Also reviewed age and gender appropriate health screening as well as vaccine recommendations. No vaccines due.  Labs good except mildly climbing LDL over the last few years.  He wants to see how his dietary and exercise changes will help this, no change in crestor 5mg  qod at this time. Will have him recheck BMET in 1 week to see if potassium has come down with cutting ACE-I dose in half + low potassium diet. Prost ca screening: DRE and PSA normal.  He has chosen (and I agree) to stop prostate cancer screening now. Colon ca screening: last colonoscopy 08/2014---no further screening colonoscopies were recommended.  An After Visit Summary was printed and given to the patient.  FOLLOW UP:  Return for lab visit for BMET 1 wk; f/u office visit 6 mo HTN/Hyperlip.  Signed:  Crissie Sickles, MD           02/26/2016

## 2016-03-04 ENCOUNTER — Other Ambulatory Visit (INDEPENDENT_AMBULATORY_CARE_PROVIDER_SITE_OTHER): Payer: BLUE CROSS/BLUE SHIELD

## 2016-03-04 DIAGNOSIS — E875 Hyperkalemia: Secondary | ICD-10-CM

## 2016-03-04 LAB — BASIC METABOLIC PANEL
BUN: 16 mg/dL (ref 6–23)
CO2: 30 mEq/L (ref 19–32)
Calcium: 9.4 mg/dL (ref 8.4–10.5)
Chloride: 105 mEq/L (ref 96–112)
Creatinine, Ser: 0.99 mg/dL (ref 0.40–1.50)
GFR: 77.99 mL/min (ref >60.00)
Glucose, Bld: 92 mg/dL (ref 70–99)
Potassium: 5 mEq/L (ref 3.5–5.1)
Sodium: 141 mEq/L (ref 135–145)

## 2016-03-05 ENCOUNTER — Encounter: Payer: Self-pay | Admitting: Family Medicine

## 2016-08-26 ENCOUNTER — Encounter: Payer: Self-pay | Admitting: Family Medicine

## 2016-08-26 ENCOUNTER — Ambulatory Visit (INDEPENDENT_AMBULATORY_CARE_PROVIDER_SITE_OTHER): Payer: Medicare Other | Admitting: Family Medicine

## 2016-08-26 VITALS — BP 129/81 | HR 63 | Temp 97.6°F | Resp 16 | Wt 169.4 lb

## 2016-08-26 DIAGNOSIS — I10 Essential (primary) hypertension: Secondary | ICD-10-CM | POA: Diagnosis not present

## 2016-08-26 DIAGNOSIS — E7800 Pure hypercholesterolemia, unspecified: Secondary | ICD-10-CM | POA: Diagnosis not present

## 2016-08-26 DIAGNOSIS — Z8639 Personal history of other endocrine, nutritional and metabolic disease: Secondary | ICD-10-CM | POA: Diagnosis not present

## 2016-08-26 DIAGNOSIS — Z23 Encounter for immunization: Secondary | ICD-10-CM | POA: Diagnosis not present

## 2016-08-26 LAB — BASIC METABOLIC PANEL
BUN: 15 mg/dL (ref 6–23)
CO2: 33 mEq/L — ABNORMAL HIGH (ref 19–32)
Calcium: 8.8 mg/dL (ref 8.4–10.5)
Chloride: 104 mEq/L (ref 96–112)
Creatinine, Ser: 0.87 mg/dL (ref 0.40–1.50)
GFR: 90.42 mL/min (ref >60.00)
Glucose, Bld: 87 mg/dL (ref 70–99)
Potassium: 4.9 mEq/L (ref 3.5–5.1)
Sodium: 141 mEq/L (ref 135–145)

## 2016-08-26 MED ORDER — ROSUVASTATIN CALCIUM 5 MG PO TABS: 5.0000 mg | ORAL_TABLET | Freq: Every day | ORAL | 3 refills | Status: DC

## 2016-08-26 MED ORDER — LISINOPRIL 5 MG PO TABS: 5.0000 mg | ORAL_TABLET | Freq: Every day | ORAL | 3 refills | Status: DC

## 2016-08-26 MED ORDER — VERAPAMIL HCL ER 240 MG PO TBCR: 240.0000 mg | EXTENDED_RELEASE_TABLET | Freq: Every day | ORAL | 3 refills | Status: DC

## 2016-08-26 NOTE — Addendum Note (Signed)
Addended by: Gordy Councilman on: 08/26/2016 10:19 AM   Modules accepted: Orders

## 2016-08-26 NOTE — Progress Notes (Signed)
Pre visit review using our clinic review tool, if applicable. No additional management support is needed unless otherwise documented below in the visit note. 

## 2016-08-26 NOTE — Progress Notes (Signed)
OFFICE VISIT  08/26/2016   CC:  Chief Complaint  Patient presents with  . Follow-up    HTN   HPI:    Patient is a 75 y.o.  male who presents for 6 mo f/u HTN, HLD, hx of hyperkalemia. No probs, feels good.  Home bp monitoring normal consistently. Exercise: 3 times per week treadmill x 30 min + some wts. Tries to eat a heart healthy diet and low K diet.  ROS: no CP, no SOB, no myalgias, no dizziness, no HAs, no vision complaints. No palpitations.  Past Medical History:  Diagnosis Date  . Colon polyps    Repeat TCS on 09/04/2014 showed no polyps and GI MD recommended NO FURTHER COLONOSCOPIES  . Hyperkalemia    ? secondary to ACE-I?  Dose decreased by 50% 02/22/16 and repeat K was in normal range.   . Hyperlipidemia   . Hypertension   . Nonmelanoma skin cancer 2009   sees skin MD annually    Past Surgical History:  Procedure Laterality Date  . COLONOSCOPY  10/12/12015   mild diverticulosis.  No polyps.  No further screening colonoscopies recommended ([facility])  . COLONOSCOPY W/ POLYPECTOMY  06/2009   adenomatous polyps; recall 06/2014 ([facility] [place]  . INGUINAL HERNIA REPAIR Left 2001  . TONSILLECTOMY      Outpatient Medications Prior to Visit  Medication Sig Dispense Refill  . aspirin 81 MG tablet Take 81 mg by mouth daily.    . Multiple Vitamin (MULTIVITAMIN) tablet Take 1 tablet by mouth daily.    . Omega-3 Fatty Acids (FISH OIL) 1000 MG CAPS Take by mouth.    Marland Kitchen lisinopril (PRINIVIL,ZESTRIL) 10 MG tablet Take 1 tablet (10 mg total) by mouth daily. 90 tablet 3  . rosuvastatin (CRESTOR) 5 MG tablet Take 1 tablet (5 mg total) by mouth daily. 90 tablet 3  . verapamil (VERELAN PM) 240 MG 24 hr capsule Take 1 capsule (240 mg total) by mouth daily. 90 capsule 3   No facility-administered medications prior to visit.     No Known Allergies  ROS As per HPI  PE: Blood pressure 129/81, pulse 63, temperature 97.6 F (36.4 C),  temperature source Oral, resp. rate 16, weight 169 lb 6.4 oz (76.8 kg), SpO2 97 %. Gen: Alert, well appearing.  Patient is oriented to person, place, time, and situation. AFFECT: pleasant, lucid thought and speech. CV: RRR, no m/r/g.   LUNGS: CTA bilat, nonlabored resps, good aeration in all lung fields. EXT: no clubbing, cyanosis, or edema.    LABS:    Chemistry      Component Value Date/Time   NA 141 03/04/2016 1321   K 5.0 03/04/2016 1321   CL 105 03/04/2016 1321   CO2 30 03/04/2016 1321   BUN 16 03/04/2016 1321   CREATININE 0.99 03/04/2016 1321      Component Value Date/Time   CALCIUM 9.4 03/04/2016 1321   ALKPHOS 55 02/19/2016 0945   AST 17 02/19/2016 0945   ALT 16 02/19/2016 0945   BILITOT 0.6 02/19/2016 0945     Lab Results  Component Value Date   CHOL 203 (H) 02/19/2016   HDL 65.20 02/19/2016   LDLCALC 114 (H) 02/19/2016   TRIG 119.0 02/19/2016   CHOLHDL 3 02/19/2016   IMPRESSION AND PLAN:  1) HTN; The current medical regimen is effective;  continue present plan and medications. Lytes/cr today.  2) Hyperlipidemia: tolerating statin.  Lipids stable 01/2016.  Plan to recheck these at  f/u in 6 mo.  3) Hx of hyperkalemia: we cut back his lisinopril dosing by 50% and he eats a low K diet and this has helped. Recheck lytes today.  4) Preventative health care: flu vaccine today.  An After Visit Summary was printed and given to the patient.  FOLLOW UP: Return in about 6 months (around 02/24/2017) for routine chronic illness f/u (30 min).  Signed:  Crissie Sickles, MD           08/26/2016

## 2016-09-03 ENCOUNTER — Telehealth: Payer: Self-pay

## 2016-09-03 MED ORDER — VERAPAMIL HCL ER 240 MG PO CP24: 240.0000 mg | ORAL_CAPSULE | Freq: Every day | ORAL | 3 refills | Status: DC

## 2016-09-03 NOTE — Telephone Encounter (Signed)
Verapamil caps eRx'd.

## 2016-09-03 NOTE — Telephone Encounter (Signed)
Patient called stating that insurance will not pay for Verapamil in tablet form, would like to go back to capsule form as before.

## 2016-11-24 DIAGNOSIS — H903 Sensorineural hearing loss, bilateral: Secondary | ICD-10-CM

## 2016-11-24 HISTORY — DX: Sensorineural hearing loss, bilateral: H90.3

## 2017-02-18 ENCOUNTER — Telehealth: Payer: Self-pay

## 2017-02-18 NOTE — Telephone Encounter (Signed)
LM requesting CB regarding AWV. Requesting patient to have AWV with [facility] on 02/24/17 @ 11:30, after OV with PCP.

## 2017-02-19 NOTE — Telephone Encounter (Signed)
Patient called back. Patient is scheduled at 11:30am on 4/3 for AWV w/ Kim.

## 2017-02-24 ENCOUNTER — Encounter: Payer: Self-pay | Admitting: Family Medicine

## 2017-02-24 ENCOUNTER — Ambulatory Visit (INDEPENDENT_AMBULATORY_CARE_PROVIDER_SITE_OTHER): Payer: Medicare Other | Admitting: Family Medicine

## 2017-02-24 VITALS — BP 144/78 | HR 58 | Temp 97.5°F | Resp 16 | Ht 69.0 in | Wt 172.5 lb

## 2017-02-24 DIAGNOSIS — E7800 Pure hypercholesterolemia, unspecified: Secondary | ICD-10-CM

## 2017-02-24 DIAGNOSIS — I10 Essential (primary) hypertension: Secondary | ICD-10-CM

## 2017-02-24 DIAGNOSIS — R94120 Abnormal auditory function study: Secondary | ICD-10-CM | POA: Diagnosis not present

## 2017-02-24 DIAGNOSIS — Z125 Encounter for screening for malignant neoplasm of prostate: Secondary | ICD-10-CM

## 2017-02-24 DIAGNOSIS — Z0000 Encounter for general adult medical examination without abnormal findings: Secondary | ICD-10-CM | POA: Diagnosis not present

## 2017-02-24 DIAGNOSIS — Z8639 Personal history of other endocrine, nutritional and metabolic disease: Secondary | ICD-10-CM | POA: Diagnosis not present

## 2017-02-24 LAB — LIPID PANEL
Cholesterol: 194 mg/dL (ref 0–200)
HDL: 62.5 mg/dL (ref >39.00)
LDL Cholesterol: 104 mg/dL — ABNORMAL HIGH (ref 0–99)
NonHDL: 131.02
Total CHOL/HDL Ratio: 3
Triglycerides: 133 mg/dL (ref 0.0–149.0)
VLDL: 26.6 mg/dL (ref 0.0–40.0)

## 2017-02-24 LAB — COMPREHENSIVE METABOLIC PANEL
ALT: 18 U/L (ref 0–53)
AST: 17 U/L (ref 0–37)
Albumin: 4.2 g/dL (ref 3.5–5.2)
Alkaline Phosphatase: 65 U/L (ref 39–117)
BUN: 13 mg/dL (ref 6–23)
CO2: 32 mEq/L (ref 19–32)
Calcium: 9.4 mg/dL (ref 8.4–10.5)
Chloride: 103 mEq/L (ref 96–112)
Creatinine, Ser: 0.92 mg/dL (ref 0.40–1.50)
GFR: 84.66 mL/min (ref >60.00)
Glucose, Bld: 107 mg/dL — ABNORMAL HIGH (ref 70–99)
Potassium: 5.4 mEq/L — ABNORMAL HIGH (ref 3.5–5.1)
Sodium: 140 mEq/L (ref 135–145)
Total Bilirubin: 0.6 mg/dL (ref 0.2–1.2)
Total Protein: 6.6 g/dL (ref 6.0–8.3)

## 2017-02-24 LAB — PSA, MEDICARE: PSA: 2.16 ng/ml (ref 0.10–4.00)

## 2017-02-24 MED ORDER — HYDROCHLOROTHIAZIDE 12.5 MG PO CAPS: 12.5000 mg | ORAL_CAPSULE | Freq: Every day | ORAL | 1 refills | Status: DC

## 2017-02-24 NOTE — Progress Notes (Signed)
Pre visit review using our clinic review tool, if applicable. No additional management support is needed unless otherwise documented below in the visit note. 

## 2017-02-24 NOTE — Progress Notes (Signed)
Subjective:   Curtis Matthews is a 76 y.o. male who presents for an Initial Medicare Annual Wellness Visit.  Review of Systems  No ROS.  Medicare Wellness Visit.  Cardiac Risk Factors include: advanced age (>44men, >49 women);dyslipidemia;hypertension;male gender;family history of premature cardiovascular disease   Sleep patterns: Sleeps 8-10 hours, feels rested. Up to void x 1.  Home Safety/Smoke Alarms:  Smoke detectors and security in place.  Living environment; residence and Firearm Safety: Lives with wife in 1 story home, steps at door with rails. Feels safe in home. Firearms locked away.  Seat Belt Safety/Bike Helmet: Wears seat belt.   Counseling:   Eye Exam-Last exam 05/2016, yearly by [facility] in [place]. Dental-Last exam 11/2016, every 6 months by Dr. Redmond Pulling in [facility]   Male:   [facility]-Colonoscopy 09/04/2014, [facility] [place]. Patient report diverticulitis. No recall.  PSA-02/19/2016, 1.510.    Objective:    Today's Vitals   02/24/17 1059  BP: (!) 144/78  Pulse: (!) 58  Resp: 16  Temp: 97.5 F (36.4 C)  TempSrc: Oral  SpO2: 97%  Weight: 172 lb 8 oz (78.2 kg)  Height: 5\' 9"  (1.753 m)   Body mass index is 25.47 kg/m.  Current Medications (verified) Outpatient Encounter Prescriptions as of 02/24/2017  Medication Sig  . aspirin 81 MG tablet Take 81 mg by mouth daily.  Marland Kitchen lisinopril (PRINIVIL,ZESTRIL) 5 MG tablet Take 1 tablet (5 mg total) by mouth daily.  . Multiple Vitamin (MULTIVITAMIN) tablet Take 1 tablet by mouth daily.  . rosuvastatin (CRESTOR) 5 MG tablet Take 1 tablet (5 mg total) by mouth daily.  . verapamil (VERELAN PM) 240 MG 24 hr capsule Take 1 capsule (240 mg total) by mouth at bedtime.  . hydrochlorothiazide (MICROZIDE) 12.5 MG capsule Take 1 capsule (12.5 mg total) by mouth daily.  . [DISCONTINUED] Omega-3 Fatty Acids (FISH OIL) 1000 MG CAPS Take by mouth.   No facility-administered encounter medications on file as of 02/24/2017.      Allergies (verified) Patient has no known allergies.   History: Past Medical History:  Diagnosis Date  . Colon polyps    Repeat TCS on 09/04/2014 showed no polyps and GI MD recommended NO FURTHER COLONOSCOPIES  . Hyperkalemia    ? secondary to ACE-I?  Dose decreased by 50% 02/22/16 and repeat K was in normal range.   . Hyperlipidemia   . Hypertension   . Nonmelanoma skin cancer 2009   sees skin MD annually   Past Surgical History:  Procedure Laterality Date  . COLONOSCOPY  10/12/12015   mild diverticulosis.  No polyps.  No further screening colonoscopies recommended ([facility])  . COLONOSCOPY W/ POLYPECTOMY  06/2009   adenomatous polyps; recall 06/2014 ([facility] [place]  . INGUINAL HERNIA REPAIR Left 2001  . TONSILLECTOMY     Family History  Problem Relation Age of Onset  . Heart disease Mother   . Hypertension Mother    Social History   Occupational History  . Not on file.   Social History Main Topics  . Smoking status: Never Smoker  . Smokeless tobacco: Never Used  . Alcohol use 0.6 oz/week    1 Glasses of wine per week     Comment: glass of wine daily  . Drug use: No  . Sexual activity: Not on file   Tobacco Counseling Counseling given: No   Activities of Daily Living In your present state of health, do you have any difficulty performing the following activities:  02/24/2017  Hearing? N  Vision? N  Difficulty concentrating or making decisions? N  Walking or climbing stairs? N  Dressing or bathing? N  Doing errands, shopping? N  Preparing Food and eating ? N  Using the Toilet? N  In the past six months, have you accidently leaked urine? N  Do you have problems with loss of bowel control? N  Managing your Medications? N  Managing your Finances? N  Housekeeping or managing your Housekeeping? N  Some recent data might be hidden    Immunizations and Health Maintenance Immunization History  Administered Date(s)  Administered  . Influenza Split 09/24/2013  . Influenza, High Dose Seasonal PF 08/28/2015, 08/26/2016  . Influenza,inj,Quad PF,36+ Mos 08/23/2014  . Pneumococcal Conjugate-13 02/15/2014  . Pneumococcal Polysaccharide-23 11/24/2009  . Tdap 11/24/2009  . Zoster 11/24/2009   There are no preventive care reminders to display for this patient.  Patient Care Team: Tammi Sou, MD as PCP - General (Family Medicine) Jerene Canny, MD as Consulting Physician (Dermatology)  Indicate any recent Medical Services you may have received from other than [facility] providers in the past year (date may be approximate).    Assessment:   This is a routine wellness examination for Curtis Matthews. Physical assessment deferred to PCP.   Hearing/Vision screen Vision Screening Comments: Wears glasses.   Dietary issues and exercise activities discussed: Current Exercise Habits: Structured exercise class, Type of exercise: strength training/weights;treadmill, Time (Minutes): > 60, Frequency (Times/Week): 3, Weekly Exercise (Minutes/Week): 0, Exercise limited by: None identified   Diet (meal preparation, eat out, water intake, caffeinated beverages, dairy products, fruits and vegetables):  Drinks water and coffee.   Breakfast: Coffee, toast, oatmeal Lunch: soup Dinner: lean protein, vegetables  Discussed heart healthy diet and continuing to be active.   Goals    . Weight (lb) < 166 lb (75.3 kg)          Lose 5 pounds by diet modification and exercise.      Depression Screen PHQ 2/9 Scores 02/24/2017 08/28/2015  PHQ - 2 Score 0 0    Fall Risk Fall Risk  02/24/2017 08/28/2015  Falls in the past year? No No    Cognitive Function:       Ad8 score reviewed for issues:  Issues making decisions: no  Less interest in hobbies / activities: no  Repeats questions, stories (family complaining): no  Trouble using ordinary gadgets (microwave, computer, phone): no  Forgets the month or year:  no  Mismanaging finances: no  Remembering appts: no  Daily problems with thinking and/or memory: no Ad8 score is=0     Screening Tests Health Maintenance  Topic Date Due  . INFLUENZA VACCINE  06/24/2017  . TETANUS/TDAP  11/25/2019  . PNA vac Low Risk Adult  Completed        Plan:     Continue to eat heart healthy diet (full of fruits, vegetables, whole grains, lean protein, water--limit salt, fat, and sugar intake) and increase physical activity as tolerated.  Bring a copy of your advance directives to your next office visit.  Make appointment with Audiology.   During the course of the visit Curtis Matthews was educated and counseled about the following appropriate screening and preventive services:   Vaccines to include Pneumoccal, Influenza, Hepatitis B, Td, Zostavax, HCV  Colorectal cancer screening  Cardiovascular disease screening  Diabetes screening  Glaucoma screening  Nutrition counseling  Prostate cancer screening  Patient Instructions (the written plan) were given to the patient.  Gerilyn Nestle, RN   02/24/2017

## 2017-02-24 NOTE — Progress Notes (Signed)
OFFICE VISIT  02/24/2017   CC:  Chief Complaint  Patient presents with  . Follow-up    RCI, pt is fasting.    HPI:    Patient is a 76 y.o. Caucasian male who presents for 6 mo f/u HTN, hyperlipidemia, hx of hyperkalemia (most likely secondary to ACE-I: see PMH section).  Home bp monitoring: 140s/60s, no HR data from home. No HAs, no dizziness, no palpitations.   Low Na diet in general.  Not eating low potassium diet at this time.  Hyperlipidemia: taking 5 mg qod.  This has seemed to be adequate.  He doesn't recall any side effects from this med.  Brother dx'd with prostate ca last fall.  Pt had originally decided to forego anymore prost ca screening but specifically requests PSA today.   Past Medical History:  Diagnosis Date  . Colon polyps    Repeat TCS on 09/04/2014 showed no polyps and GI MD recommended NO FURTHER COLONOSCOPIES  . Hyperkalemia    ? secondary to ACE-I?  Dose decreased by 50% 02/22/16 and repeat K was in normal range.   . Hyperlipidemia   . Hypertension   . Nonmelanoma skin cancer 2009   sees skin MD annually    Past Surgical History:  Procedure Laterality Date  . COLONOSCOPY  10/12/12015   mild diverticulosis.  No polyps.  No further screening colonoscopies recommended ([facility])  . COLONOSCOPY W/ POLYPECTOMY  06/2009   adenomatous polyps; recall 06/2014 ([facility] [place]  . INGUINAL HERNIA REPAIR Left 2001  . TONSILLECTOMY      Outpatient Medications Prior to Visit  Medication Sig Dispense Refill  . aspirin 81 MG tablet Take 81 mg by mouth daily.    Marland Kitchen lisinopril (PRINIVIL,ZESTRIL) 5 MG tablet Take 1 tablet (5 mg total) by mouth daily. 90 tablet 3  . Multiple Vitamin (MULTIVITAMIN) tablet Take 1 tablet by mouth daily.    . rosuvastatin (CRESTOR) 5 MG tablet Take 1 tablet (5 mg total) by mouth daily. 90 tablet 3  . verapamil (VERELAN PM) 240 MG 24 hr capsule Take 1 capsule (240 mg total) by mouth at bedtime. 90  capsule 3  . Omega-3 Fatty Acids (FISH OIL) 1000 MG CAPS Take by mouth.     No facility-administered medications prior to visit.     No Known Allergies  ROS As per HPI  PE: Blood pressure (!) 144/78, pulse (!) 58, temperature 97.5 F (36.4 C), temperature source Oral, resp. rate 16, height 5\' 9"  (1.753 m), weight 172 lb 8 oz (78.2 kg), SpO2 97 %. Gen: Alert, well appearing.  Patient is oriented to person, place, time, and situation. AFFECT: pleasant, lucid thought and speech. CV: RRR, no m/r/g.   LUNGS: CTA bilat, nonlabored resps, good aeration in all lung fields. EXT: no clubbing, cyanosis, or edema.   LABS:  Lab Results  Component Value Date   TSH 3.14 02/19/2016   Lab Results  Component Value Date   WBC 5.7 02/19/2016   HGB 14.3 02/19/2016   HCT 42.6 02/19/2016   MCV 95.5 02/19/2016   PLT 227.0 02/19/2016   Lab Results  Component Value Date   CREATININE 0.87 08/26/2016   BUN 15 08/26/2016   NA 141 08/26/2016   K 4.9 08/26/2016   CL 104 08/26/2016   CO2 33 (H) 08/26/2016   Lab Results  Component Value Date   ALT 16 02/19/2016   AST 17 02/19/2016   ALKPHOS 55 02/19/2016   BILITOT 0.6  02/19/2016   Lab Results  Component Value Date   CHOL 203 (H) 02/19/2016   Lab Results  Component Value Date   HDL 65.20 02/19/2016   Lab Results  Component Value Date   LDLCALC 114 (H) 02/19/2016   Lab Results  Component Value Date   TRIG 119.0 02/19/2016   Lab Results  Component Value Date   CHOLHDL 3 02/19/2016   Lab Results  Component Value Date   PSA 1.51 02/19/2016   PSA 1.56 02/20/2015   PSA 1.36 02/09/2014    IMPRESSION AND PLAN:  1) HTN; not ideal control.  Continue current meds and add hctz 12.5mg  qd. Continue home bp monitoring and f/u 1 mo to review.  2) Hyperlipidemia: compliant/tolerating statin.  Check FLP and AST/ALT today.  3) Hx of hyperkalemia: suspected due to ACE-I.  This normalized with decrease in ACE-I dose. Recheck lytes/cr  today.  4) Prostate ca screening: bother recently dx with prostate ca (age 61) and patient wants to monitor PSA annually--ordered PSA today.  An After Visit Summary was printed and given to the patient.  FOLLOW UP: Return in about 4 weeks (around 03/24/2017) for f/u HTN.  Signed:  Crissie Sickles, MD           02/24/2017

## 2017-02-24 NOTE — Progress Notes (Signed)
Medicare AWV reviewed and agree.  Signed:  Crissie Sickles, MD           02/24/2017

## 2017-02-24 NOTE — Patient Instructions (Addendum)
Continue to eat heart healthy diet (full of fruits, vegetables, whole grains, lean protein, water--limit salt, fat, and sugar intake) and increase physical activity as tolerated.  Bring a copy of your advance directives to your next office visit.  Make appointment with Audiology.    Fall Prevention in the Home Falls can cause injuries. They can happen to people of all ages. There are many things you can do to make your home safe and to help prevent falls. What can I do on the outside of my home?  Regularly fix the edges of walkways and driveways and fix any cracks.  Remove anything that might make you trip as you walk through a door, such as a raised step or threshold.  Trim any bushes or trees on the path to your home.  Use bright outdoor lighting.  Clear any walking paths of anything that might make someone trip, such as rocks or tools.  Regularly check to see if handrails are loose or broken. Make sure that both sides of any steps have handrails.  Any raised decks and porches should have guardrails on the edges.  Have any leaves, snow, or ice cleared regularly.  Use sand or salt on walking paths during winter.  Clean up any spills in your garage right away. This includes oil or grease spills. What can I do in the bathroom?  Use night lights.  Install grab bars by the toilet and in the tub and shower. Do not use towel bars as grab bars.  Use non-skid mats or decals in the tub or shower.  If you need to sit down in the shower, use a plastic, non-slip stool.  Keep the floor dry. Clean up any water that spills on the floor as soon as it happens.  Remove soap buildup in the tub or shower regularly.  Attach bath mats securely with double-sided non-slip rug tape.  Do not have throw rugs and other things on the floor that can make you trip. What can I do in the bedroom?  Use night lights.  Make sure that you have a light by your bed that is easy to reach.  Do not use  any sheets or blankets that are too big for your bed. They should not hang down onto the floor.  Have a firm chair that has side arms. You can use this for support while you get dressed.  Do not have throw rugs and other things on the floor that can make you trip. What can I do in the kitchen?  Clean up any spills right away.  Avoid walking on wet floors.  Keep items that you use a lot in easy-to-reach places.  If you need to reach something above you, use a strong step stool that has a grab bar.  Keep electrical cords out of the way.  Do not use floor polish or wax that makes floors slippery. If you must use wax, use non-skid floor wax.  Do not have throw rugs and other things on the floor that can make you trip. What can I do with my stairs?  Do not leave any items on the stairs.  Make sure that there are handrails on both sides of the stairs and use them. Fix handrails that are broken or loose. Make sure that handrails are as long as the stairways.  Check any carpeting to make sure that it is firmly attached to the stairs. Fix any carpet that is loose or worn.  Avoid having throw  rugs at the top or bottom of the stairs. If you do have throw rugs, attach them to the floor with carpet tape.  Make sure that you have a light switch at the top of the stairs and the bottom of the stairs. If you do not have them, ask someone to add them for you. What else can I do to help prevent falls?  Wear shoes that:  Do not have high heels.  Have rubber bottoms.  Are comfortable and fit you well.  Are closed at the toe. Do not wear sandals.  If you use a stepladder:  Make sure that it is fully opened. Do not climb a closed stepladder.  Make sure that both sides of the stepladder are locked into place.  Ask someone to hold it for you, if possible.  Clearly mark and make sure that you can see:  Any grab bars or handrails.  First and last steps.  Where the edge of each step  is.  Use tools that help you move around (mobility aids) if they are needed. These include:  Canes.  Walkers.  Scooters.  Crutches.  Turn on the lights when you go into a dark area. Replace any light bulbs as soon as they burn out.  Set up your furniture so you have a clear path. Avoid moving your furniture around.  If any of your floors are uneven, fix them.  If there are any pets around you, be aware of where they are.  Review your medicines with your doctor. Some medicines can make you feel dizzy. This can increase your chance of falling. Ask your doctor what other things that you can do to help prevent falls. This information is not intended to replace advice given to you by your health care provider. Make sure you discuss any questions you have with your health care provider. Document Released: 09/06/2009 Document Revised: 04/17/2016 Document Reviewed: 12/15/2014 Elsevier Interactive Patient Education  2017 [place] Maintenance, Male A healthy lifestyle and preventive care is important for your health and wellness. Ask your health care provider about what schedule of regular examinations is right for you. What should I know about weight and diet?  Eat a Healthy Diet  Eat plenty of vegetables, fruits, whole grains, low-fat dairy products, and lean protein.  Do not eat a lot of foods high in solid fats, added sugars, or salt. Maintain a Healthy Weight  Regular exercise can help you achieve or maintain a healthy weight. You should:  Do at least 150 minutes of exercise each week. The exercise should increase your heart rate and make you sweat (moderate-intensity exercise).  Do strength-training exercises at least twice a week. Watch Your Levels of Cholesterol and Blood Lipids  Have your blood tested for lipids and cholesterol every 5 years starting at 78 years of age. If you are at high risk for heart disease, you should start having your blood tested when  you are 78 years old. You may need to have your cholesterol levels checked more often if:  Your lipid or cholesterol levels are high.  You are older than 78 years of age.  You are at high risk for heart disease. What should I know about cancer screening? Many types of cancers can be detected early and may often be prevented. Lung Cancer  You should be screened every year for lung cancer if:  You are a current smoker who has smoked for at least 30 years.  You are  a former smoker who has quit within the past 15 years.  Talk to your health care provider about your screening options, when you should start screening, and how often you should be screened. Colorectal Cancer  Routine colorectal cancer screening usually begins at 78 years of age and should be repeated every 5-10 years until you are 78 years old. You may need to be screened more often if early forms of precancerous polyps or small growths are found. Your health care provider may recommend screening at an earlier age if you have risk factors for colon cancer.  Your health care provider may recommend using home test kits to check for hidden blood in the stool.  A small camera at the end of a tube can be used to examine your colon (sigmoidoscopy or colonoscopy). This checks for the earliest forms of colorectal cancer. Prostate and Testicular Cancer  Depending on your age and overall health, your health care provider may do certain tests to screen for prostate and testicular cancer.  Talk to your health care provider about any symptoms or concerns you have about testicular or prostate cancer. Skin Cancer  Check your skin from head to toe regularly.  Tell your health care provider about any new moles or changes in moles, especially if:  There is a change in a mole's size, shape, or color.  You have a mole that is larger than a pencil eraser.  Always use sunscreen. Apply sunscreen liberally and repeat throughout the  day.  Protect yourself by wearing long sleeves, pants, a wide-brimmed hat, and sunglasses when outside. What should I know about heart disease, diabetes, and high blood pressure?  If you are 69-30 years of age, have your blood pressure checked every 3-5 years. If you are 47 years of age or older, have your blood pressure checked every year. You should have your blood pressure measured twice-once when you are at a hospital or clinic, and once when you are not at a hospital or clinic. Record the average of the two measurements. To check your blood pressure when you are not at a hospital or clinic, you can use:  An automated blood pressure machine at a pharmacy.  A home blood pressure monitor.  Talk to your health care provider about your target blood pressure.  If you are between 61-12 years old, ask your health care provider if you should take aspirin to prevent heart disease.  Have regular diabetes screenings by checking your fasting blood sugar level.  If you are at a normal weight and have a low risk for diabetes, have this test once every three years after the age of 75.  If you are overweight and have a high risk for diabetes, consider being tested at a younger age or more often.  A one-time screening for abdominal aortic aneurysm (AAA) by ultrasound is recommended for men aged 61-75 years who are current or former smokers. What should I know about preventing infection? Hepatitis B  If you have a higher risk for hepatitis B, you should be screened for this virus. Talk with your health care provider to find out if you are at risk for hepatitis B infection. Hepatitis C  Blood testing is recommended for:  Everyone born from [birth year] through 1967.  Anyone with known risk factors for hepatitis C. Sexually Transmitted Diseases (STDs)  You should be screened each year for STDs including gonorrhea and chlamydia if:  You are sexually active and are younger than 78 years of age.  You are  older than 78 years of age and your health care provider tells you that you are at risk for this type of infection.  Your sexual activity has changed since you were last screened and you are at an increased risk for chlamydia or gonorrhea. Ask your health care provider if you are at risk.  Talk with your health care provider about whether you are at high risk of being infected with HIV. Your health care provider may recommend a prescription medicine to help prevent HIV infection. What else can I do?  Schedule regular health, dental, and eye exams.  Stay current with your vaccines (immunizations).  Do not use any tobacco products, such as cigarettes, chewing tobacco, and e-cigarettes. If you need help quitting, ask your health care provider.  Limit alcohol intake to no more than 2 drinks per day. One drink equals 12 ounces of beer, 5 ounces of wine, or 1 ounces of hard liquor.  Do not use street drugs.  Do not share needles.  Ask your health care provider for help if you need support or information about quitting drugs.  Tell your health care provider if you often feel depressed.  Tell your health care provider if you have ever been abused or do not feel safe at home. This information is not intended to replace advice given to you by your health care provider. Make sure you discuss any questions you have with your health care provider. Document Released: 05/08/2008 Document Revised: 07/09/2016 Document Reviewed: 08/14/2015 Elsevier Interactive Patient Education  2017 [employer].

## 2017-02-25 ENCOUNTER — Encounter: Payer: Self-pay | Admitting: *Deleted

## 2017-02-25 ENCOUNTER — Other Ambulatory Visit: Payer: Self-pay | Admitting: *Deleted

## 2017-02-25 DIAGNOSIS — E875 Hyperkalemia: Secondary | ICD-10-CM | POA: Insufficient documentation

## 2017-03-16 ENCOUNTER — Other Ambulatory Visit (INDEPENDENT_AMBULATORY_CARE_PROVIDER_SITE_OTHER): Payer: Medicare Other

## 2017-03-16 DIAGNOSIS — E875 Hyperkalemia: Secondary | ICD-10-CM

## 2017-03-16 LAB — BASIC METABOLIC PANEL
BUN: 17 mg/dL (ref 6–23)
CO2: 31 mEq/L (ref 19–32)
Calcium: 9.6 mg/dL (ref 8.4–10.5)
Chloride: 104 mEq/L (ref 96–112)
Creatinine, Ser: 1 mg/dL (ref 0.40–1.50)
GFR: 76.89 mL/min (ref >60.00)
Glucose, Bld: 111 mg/dL — ABNORMAL HIGH (ref 70–99)
Potassium: 5.1 mEq/L (ref 3.5–5.1)
Sodium: 141 mEq/L (ref 135–145)

## 2017-03-17 ENCOUNTER — Encounter: Payer: Self-pay | Admitting: *Deleted

## 2017-03-17 DIAGNOSIS — H838X3 Other specified diseases of inner ear, bilateral: Secondary | ICD-10-CM | POA: Diagnosis not present

## 2017-03-17 DIAGNOSIS — H9113 Presbycusis, bilateral: Secondary | ICD-10-CM | POA: Diagnosis not present

## 2017-03-17 DIAGNOSIS — H903 Sensorineural hearing loss, bilateral: Secondary | ICD-10-CM | POA: Diagnosis not present

## 2017-03-19 ENCOUNTER — Encounter: Payer: Self-pay | Admitting: Family Medicine

## 2017-03-24 ENCOUNTER — Ambulatory Visit (INDEPENDENT_AMBULATORY_CARE_PROVIDER_SITE_OTHER): Payer: Medicare Other | Admitting: Family Medicine

## 2017-03-24 ENCOUNTER — Encounter: Payer: Self-pay | Admitting: Family Medicine

## 2017-03-24 VITALS — BP 124/83 | HR 62 | Temp 97.3°F | Resp 16 | Wt 170.0 lb

## 2017-03-24 DIAGNOSIS — Z8639 Personal history of other endocrine, nutritional and metabolic disease: Secondary | ICD-10-CM

## 2017-03-24 DIAGNOSIS — R972 Elevated prostate specific antigen [PSA]: Secondary | ICD-10-CM | POA: Diagnosis not present

## 2017-03-24 DIAGNOSIS — I10 Essential (primary) hypertension: Secondary | ICD-10-CM | POA: Diagnosis not present

## 2017-03-24 MED ORDER — HYDROCHLOROTHIAZIDE 12.5 MG PO CAPS: 12.5000 mg | ORAL_CAPSULE | Freq: Every day | ORAL | 3 refills | Status: DC

## 2017-03-24 MED ORDER — LISINOPRIL 5 MG PO TABS: ORAL_TABLET | ORAL | 3 refills | Status: DC

## 2017-03-24 NOTE — Patient Instructions (Signed)
Decrease lisinopril to 1/2 of 5 mg tab once daily. If bp average remains < 130/80, then stop lisinopril altogether.

## 2017-03-24 NOTE — Progress Notes (Signed)
OFFICE VISIT  03/24/2017   CC:  Chief Complaint  Patient presents with  . Hypertension    follow up    HPI:    Patient is a 76 y.o. Caucasian male who presents for 1 mo f/u HTN. Last visit we added 12.5mg  hctz to regimen.  His potassium that day was 5.4. I instructed him to get back on low potassium diet, and we decided to recheck labs in 2 weeks to see how the hctz would help decrease K some. Repeat testing showed the potassium go down into upper limit of normal range.  Home bp monitoring since last visit: 120s/70s average, HR avg 65-70. Pt notes mild increase in urinary frequency since getting on hctz but this has gradually lessened over time. He asks about going to 2.5 mg lisinopril in order to minimize chances of further hyperkalemia.  He also asks about recent PSA testing.  We did this mainly b/c his brother was dx'd recently with prostate ca at the age of 56.  Pt noted the PSA level was well within normal limits, but the increase from last check got his attention.  He asks if this is significant.  Past Medical History:  Diagnosis Date  . Colon polyps    Repeat TCS on 09/04/2014 showed no polyps and GI MD recommended NO FURTHER COLONOSCOPIES  . Hyperkalemia    ? secondary to ACE-I?  Dose decreased by 50% 02/22/16 and repeat K was in normal range.   . Hyperlipidemia   . Hypertension   . Nonmelanoma skin cancer 2009   sees skin MD annually  . Sensorineural hearing loss (SNHL) of both ears 2018   [facility]--hearing aids recommended 02/2017.    Past Surgical History:  Procedure Laterality Date  . COLONOSCOPY  10/12/12015   mild diverticulosis.  No polyps.  No further screening colonoscopies recommended ([facility])  . COLONOSCOPY W/ POLYPECTOMY  06/2009   adenomatous polyps; recall 06/2014 ([facility] [place]  . INGUINAL HERNIA REPAIR Left 2001  . TONSILLECTOMY      Outpatient Medications Prior to Visit  Medication Sig Dispense  Refill  . aspirin 81 MG tablet Take 81 mg by mouth daily.    . Multiple Vitamin (MULTIVITAMIN) tablet Take 1 tablet by mouth daily.    . rosuvastatin (CRESTOR) 5 MG tablet Take 1 tablet (5 mg total) by mouth daily. 90 tablet 3  . verapamil (VERELAN PM) 240 MG 24 hr capsule Take 1 capsule (240 mg total) by mouth at bedtime. 90 capsule 3  . hydrochlorothiazide (MICROZIDE) 12.5 MG capsule Take 1 capsule (12.5 mg total) by mouth daily. 30 capsule 1  . lisinopril (PRINIVIL,ZESTRIL) 5 MG tablet Take 1 tablet (5 mg total) by mouth daily. 90 tablet 3   No facility-administered medications prior to visit.     No Known Allergies  ROS As per HPI  PE: Blood pressure 124/83, pulse 62, temperature 97.3 F (36.3 C), temperature source Oral, resp. rate 16, weight 170 lb (77.1 kg), SpO2 96 %. Gen: Alert, well appearing.  Patient is oriented to person, place, time, and situation. AFFECT: pleasant, lucid thought and speech. CV: RRR, no m/r/g.   LUNGS: CTA bilat, nonlabored resps, good aeration in all lung fields. EXT: no clubbing, cyanosis, or edema.   LABS:    Chemistry      Component Value Date/Time   NA 141 03/16/2017 0954   K 5.1 03/16/2017 0954   CL 104 03/16/2017 0954   CO2 31 03/16/2017 0954  BUN 17 03/16/2017 0954   CREATININE 1.00 03/16/2017 0954      Component Value Date/Time   CALCIUM 9.6 03/16/2017 0954   ALKPHOS 65 02/24/2017 1116   AST 17 02/24/2017 1116   ALT 18 02/24/2017 1116   BILITOT 0.6 02/24/2017 1116     Lab Results  Component Value Date   PSA 2.16 02/24/2017   PSA 1.51 02/19/2016   PSA 1.56 02/20/2015     IMPRESSION AND PLAN:  1) HTN; well controlled now. We'll go ahead and try to slowly get him off lisinopril, as he had requested, and continue bp monitoring. Instructions: Decrease lisinopril to 1/2 of 5 mg tab once daily. If bp average remains < 130/80, then stop lisinopril altogether. Continue hctz at 12.5mg  qd dosing.  2) Hx of hyperkalemia:  continue low K diet. Instructions: Decrease lisinopril to 1/2 of 5 mg tab once daily. If bp average remains < 130/80, then stop lisinopril altogether. Continue hctz at 12.5mg  qd dosing. We'll check potassium next at his f/u in 5-6 mo.  3) Increased PSA velocity, FH prostate cancer in brother (dx'd at age 34): PSA still w/in normal limits, but increased from 1.51 to 2.16 in the last 1 yr.  Discussed options with pt today, and decided to repeat PSA (check total and free--ordered for future today) in 6 mo at next f/u. Depending on results, we'll either continue monitoring or we'll refer to urology for further evaluation.  Pt expressed understanding and agreement with this plan.  An After Visit Summary was printed and given to the patient.  FOLLOW UP: Return for f/u 5-6 mo routine chronic illness f/u.  Signed:  Crissie Sickles, MD           03/24/2017

## 2017-06-16 DIAGNOSIS — L814 Other melanin hyperpigmentation: Secondary | ICD-10-CM | POA: Diagnosis not present

## 2017-06-16 DIAGNOSIS — L821 Other seborrheic keratosis: Secondary | ICD-10-CM | POA: Diagnosis not present

## 2017-06-16 DIAGNOSIS — D485 Neoplasm of uncertain behavior of skin: Secondary | ICD-10-CM | POA: Diagnosis not present

## 2017-06-16 DIAGNOSIS — L570 Actinic keratosis: Secondary | ICD-10-CM | POA: Diagnosis not present

## 2017-06-16 DIAGNOSIS — Z85828 Personal history of other malignant neoplasm of skin: Secondary | ICD-10-CM | POA: Diagnosis not present

## 2017-06-16 DIAGNOSIS — L820 Inflamed seborrheic keratosis: Secondary | ICD-10-CM | POA: Diagnosis not present

## 2017-07-28 DIAGNOSIS — H5212 Myopia, left eye: Secondary | ICD-10-CM | POA: Diagnosis not present

## 2017-07-28 DIAGNOSIS — H524 Presbyopia: Secondary | ICD-10-CM | POA: Diagnosis not present

## 2017-07-28 DIAGNOSIS — H52223 Regular astigmatism, bilateral: Secondary | ICD-10-CM | POA: Diagnosis not present

## 2017-07-28 DIAGNOSIS — H11153 Pinguecula, bilateral: Secondary | ICD-10-CM | POA: Diagnosis not present

## 2017-07-28 DIAGNOSIS — H40053 Ocular hypertension, bilateral: Secondary | ICD-10-CM | POA: Diagnosis not present

## 2017-07-28 DIAGNOSIS — H40013 Open angle with borderline findings, low risk, bilateral: Secondary | ICD-10-CM | POA: Diagnosis not present

## 2017-07-28 DIAGNOSIS — H43393 Other vitreous opacities, bilateral: Secondary | ICD-10-CM | POA: Diagnosis not present

## 2017-07-28 DIAGNOSIS — H5201 Hypermetropia, right eye: Secondary | ICD-10-CM | POA: Diagnosis not present

## 2017-07-28 DIAGNOSIS — H2513 Age-related nuclear cataract, bilateral: Secondary | ICD-10-CM | POA: Diagnosis not present

## 2017-09-01 ENCOUNTER — Ambulatory Visit: Payer: Medicare Other | Admitting: Family Medicine

## 2017-09-01 DIAGNOSIS — H43393 Other vitreous opacities, bilateral: Secondary | ICD-10-CM | POA: Diagnosis not present

## 2017-09-01 DIAGNOSIS — H2513 Age-related nuclear cataract, bilateral: Secondary | ICD-10-CM | POA: Diagnosis not present

## 2017-09-01 DIAGNOSIS — H524 Presbyopia: Secondary | ICD-10-CM | POA: Diagnosis not present

## 2017-09-01 DIAGNOSIS — H52223 Regular astigmatism, bilateral: Secondary | ICD-10-CM | POA: Diagnosis not present

## 2017-09-01 DIAGNOSIS — H5201 Hypermetropia, right eye: Secondary | ICD-10-CM | POA: Diagnosis not present

## 2017-09-01 DIAGNOSIS — H40053 Ocular hypertension, bilateral: Secondary | ICD-10-CM | POA: Diagnosis not present

## 2017-09-01 DIAGNOSIS — H5212 Myopia, left eye: Secondary | ICD-10-CM | POA: Diagnosis not present

## 2017-09-08 ENCOUNTER — Ambulatory Visit (INDEPENDENT_AMBULATORY_CARE_PROVIDER_SITE_OTHER): Payer: Medicare Other | Admitting: Family Medicine

## 2017-09-08 ENCOUNTER — Encounter: Payer: Self-pay | Admitting: Family Medicine

## 2017-09-08 VITALS — BP 150/76 | HR 69 | Temp 97.9°F | Resp 16 | Ht 69.0 in | Wt 173.5 lb

## 2017-09-08 DIAGNOSIS — M545 Low back pain, unspecified: Secondary | ICD-10-CM

## 2017-09-08 DIAGNOSIS — Z8639 Personal history of other endocrine, nutritional and metabolic disease: Secondary | ICD-10-CM | POA: Diagnosis not present

## 2017-09-08 DIAGNOSIS — R972 Elevated prostate specific antigen [PSA]: Secondary | ICD-10-CM | POA: Diagnosis not present

## 2017-09-08 DIAGNOSIS — Z23 Encounter for immunization: Secondary | ICD-10-CM | POA: Diagnosis not present

## 2017-09-08 DIAGNOSIS — I10 Essential (primary) hypertension: Secondary | ICD-10-CM | POA: Diagnosis not present

## 2017-09-08 LAB — BASIC METABOLIC PANEL
BUN: 16 mg/dL (ref 6–23)
CO2: 32 mEq/L (ref 19–32)
Calcium: 9.5 mg/dL (ref 8.4–10.5)
Chloride: 101 mEq/L (ref 96–112)
Creatinine, Ser: 0.93 mg/dL (ref 0.40–1.50)
GFR: 83.5 mL/min (ref >60.00)
Glucose, Bld: 105 mg/dL — ABNORMAL HIGH (ref 70–99)
Potassium: 4.7 mEq/L (ref 3.5–5.1)
Sodium: 140 mEq/L (ref 135–145)

## 2017-09-08 NOTE — Progress Notes (Signed)
OFFICE VISIT  09/08/2017   CC:  Chief Complaint  Patient presents with  . Follow-up    RCI, pt is fasting.     HPI:    Patient is a 76 y.o.  male who presents for f/u HTN, hyperlipidemia, and hx of hyperkalemia due to ACE-I. About 3 mo ago he stopped his verapamil and bp stayed normal--120s/70, HR 60s-70s. He has stayed on hctz 12.5mg  qd as his only bp med.  Also, has hx of mild increased PSA velocity.  This was tested 6 mo ago b/c he had a brother dx'd at age 78 with prostate cancer and he was significantly anxious about this. We decided to repeat PSA total and free in 6 mo --he is due for this today.  He hurt lower back about a year ago.  Still has intermittent LBP with radiation of pain down both legs, also very mild paresthesias in both legs--radicular sx's brief and resolve after getting stretched out and moving around. If he does some stretching it helps.  He is currently not taking any ibup or tylenol.    Past Medical History:  Diagnosis Date  . Colon polyps    Repeat TCS on 09/04/2014 showed no polyps and GI MD recommended NO FURTHER COLONOSCOPIES  . Hyperkalemia    ? secondary to ACE-I?  Dose decreased by 50% 02/22/16 and repeat K was in normal range.   . Hyperlipidemia   . Hypertension   . Nonmelanoma skin cancer 2009   sees skin MD annually  . Sensorineural hearing loss (SNHL) of both ears 2018   [facility]--hearing aids recommended 02/2017.    Past Surgical History:  Procedure Laterality Date  . COLONOSCOPY  10/12/12015   mild diverticulosis.  No polyps.  No further screening colonoscopies recommended ([facility])  . COLONOSCOPY W/ POLYPECTOMY  06/2009   adenomatous polyps; [place] 06[facility] [place]  . INGUINAL HERNIA REPAIR Left 2001  . TONSILLECTOMY      Outpatient Medications Prior to Visit  Medication Sig Dispense Refill  . hydrochlorothiazide (MICROZIDE) 12.5 MG capsule Take 1 capsule (12.5 mg total)  by mouth daily. 90 capsule 3  . rosuvastatin (CRESTOR) 5 MG tablet Take 1 tablet (5 mg total) by mouth daily. 90 tablet 3  . aspirin 81 MG tablet Take 81 mg by mouth daily.    Marland Kitchen lisinopril (PRINIVIL,ZESTRIL) 5 MG tablet 1/2 tab po qd (Patient not taking: Reported on 09/08/2017) 90 tablet 3  . Multiple Vitamin (MULTIVITAMIN) tablet Take 1 tablet by mouth daily.    . verapamil (VERELAN PM) 240 MG 24 hr capsule Take 1 capsule (240 mg total) by mouth at bedtime. (Patient not taking: Reported on 09/08/2017) 90 capsule 3   No facility-administered medications prior to visit.     No Known Allergies  ROS As per HPI  PE: Blood pressure (!) 157/75, pulse 69, temperature 97.9 F (36.6 C), temperature source Oral, resp. rate 16, height 5\' 9"  (1.753 m), weight 173 lb 8 oz (78.7 kg), SpO2 96 %. Gen: Alert, well appearing.  Patient is oriented to person, place, time, and situation. AFFECT: pleasant, lucid thought and speech. No further exam today.  LABS:    Chemistry      Component Value Date/Time   NA 141 03/16/2017 0954   K 5.1 03/16/2017 0954   CL 104 03/16/2017 0954   CO2 31 03/16/2017 0954   BUN 17 03/16/2017 0954   CREATININE 1.00 03/16/2017 0954  Component Value Date/Time   CALCIUM 9.6 03/16/2017 0954   ALKPHOS 65 02/24/2017 1116   AST 17 02/24/2017 1116   ALT 18 02/24/2017 1116   BILITOT 0.6 02/24/2017 1116     Lab Results  Component Value Date   CHOL 194 02/24/2017   HDL 62.50 02/24/2017   LDLCALC 104 (H) 02/24/2017   TRIG 133.0 02/24/2017   CHOLHDL 3 02/24/2017   Lab Results  Component Value Date   PSA 2.16 02/24/2017   PSA 1.51 02/19/2016   PSA 1.56 02/20/2015     IMPRESSION AND PLAN:  1) HTN; The current medical regimen is effective;  continue present plan and medications (hctz 12.5mg  qd). Lytes/cr today.  2) Hx of hyperkalemia: while on ACE-I.  He has now totally taken himself off of lisinopril and bp remains well controlled. Lytes/cr today.  3) )  Increased PSA velocity.  Release PSA lab order so this can be drawn here today.  4) Episodic LBP--possible mild radicular sx's, but sounds more like tight leg muscles. He was wondering what would be the next step in trying to get it to be less bothersome to him (says sx's are mild most of the time when they occur).  Start with ibuprofen 600 mg tid prn with food.   If this is not helpful then I recommend we check a plain film of his lumbar spine and refer him to PT. He is agreeable to this plan. Continue home stretching---which has been helpful.  An After Visit Summary was printed and given to the patient.  FOLLOW UP: No Follow-up on file.  Signed:  Crissie Sickles, MD           09/08/2017

## 2017-09-09 ENCOUNTER — Encounter: Payer: Self-pay | Admitting: Family Medicine

## 2017-09-09 LAB — PSA, TOTAL AND FREE
PSA, % Free: 19 % (calc) — ABNORMAL LOW (ref >25)
PSA, Free: 0.3 ng/mL
PSA, Total: 1.6 ng/mL (ref <=4.0)

## 2017-09-09 LAB — EXTRA LAV TOP TUBE

## 2017-10-17 ENCOUNTER — Other Ambulatory Visit: Payer: Self-pay | Admitting: Family Medicine

## 2017-10-19 DIAGNOSIS — M545 Low back pain: Secondary | ICD-10-CM | POA: Diagnosis not present

## 2017-10-19 DIAGNOSIS — M5136 Other intervertebral disc degeneration, lumbar region: Secondary | ICD-10-CM | POA: Diagnosis not present

## 2018-01-08 ENCOUNTER — Other Ambulatory Visit: Payer: Self-pay | Admitting: Family Medicine

## 2018-01-08 ENCOUNTER — Telehealth: Payer: Self-pay | Admitting: Family Medicine

## 2018-01-08 MED ORDER — VERAPAMIL HCL ER 240 MG PO CP24: 240.0000 mg | ORAL_CAPSULE | Freq: Every day | ORAL | 3 refills | Status: DC

## 2018-01-08 NOTE — Telephone Encounter (Signed)
Pt called regarding Verapamil that  He had been prescribed last year. He was on it then and he had mentioned to his pcp that he wanted to get off of it, since his b/p was coming down.  He weaned himself off of it the last of the summer of 2018. So he states that it would not be on the list of meds now.  But now his b/p is back up and he wants to get back on the Verapamil. He had some left over and so he is taking what he has at home now. He has about 10 pills left now. So he would like to have this med refilled again.

## 2018-01-08 NOTE — Telephone Encounter (Signed)
Left detailed message on cell vm, okay per DPR.  

## 2018-01-08 NOTE — Telephone Encounter (Signed)
OK. Verapamil eRx'd to [facility].

## 2018-01-08 NOTE — Telephone Encounter (Signed)
Pharm: [facility]  Please advise. Thanks.

## 2018-01-13 MED ORDER — VERAPAMIL HCL ER 240 MG PO TBCR: 240.0000 mg | EXTENDED_RELEASE_TABLET | Freq: Every day | ORAL | 3 refills | Status: DC

## 2018-01-13 NOTE — Telephone Encounter (Signed)
[facility] did not receive prescription. Please resubmit, any question please call [phone]

## 2018-01-13 NOTE — Addendum Note (Signed)
Addended by: Onalee Hua on: 01/13/2018 01:33 PM   Modules accepted: Orders

## 2018-01-13 NOTE — Telephone Encounter (Signed)
Called [facility], they did receive Rx but they had a question about the Rx. Rx has verapamil (Verelan PM) 240mg  caps, this med only comes in 100mg , 200mg , or 300mg . They do have a records of verapamil (Calan SR) that is 240mg . Which medication would you like filled? Please advise. Thanks.

## 2018-01-13 NOTE — Telephone Encounter (Signed)
Rx eRxed to [place].

## 2018-01-13 NOTE — Telephone Encounter (Signed)
Calan SR 240mg , 1 qd, #90, RF x 3 is good.  Thx.

## 2018-02-09 ENCOUNTER — Ambulatory Visit (HOSPITAL_BASED_OUTPATIENT_CLINIC_OR_DEPARTMENT_OTHER)
Admission: RE | Admit: 2018-02-09 | Discharge: 2018-02-09 | Disposition: A | Discharge status: Home / Self Care | Payer: Medicare Other | Source: Ambulatory Visit | Attending: Family Medicine | Admitting: Family Medicine

## 2018-02-09 ENCOUNTER — Ambulatory Visit (INDEPENDENT_AMBULATORY_CARE_PROVIDER_SITE_OTHER): Payer: Medicare Other | Admitting: Family Medicine

## 2018-02-09 ENCOUNTER — Encounter: Payer: Self-pay | Admitting: Family Medicine

## 2018-02-09 VITALS — BP 152/76 | HR 71 | Temp 97.5°F | Resp 18 | Wt 177.0 lb

## 2018-02-09 DIAGNOSIS — M7989 Other specified soft tissue disorders: Secondary | ICD-10-CM | POA: Diagnosis not present

## 2018-02-09 DIAGNOSIS — M19041 Primary osteoarthritis, right hand: Secondary | ICD-10-CM

## 2018-02-09 LAB — CBC WITH DIFFERENTIAL/PLATELET
Basophils Absolute: 0 10*3/uL (ref 0.0–0.1)
Basophils Relative: 0.6 % (ref 0.0–3.0)
Eosinophils Absolute: 0.3 10*3/uL (ref 0.0–0.7)
Eosinophils Relative: 4.2 % (ref 0.0–5.0)
HCT: 44.5 % (ref 39.0–52.0)
Hemoglobin: 15 g/dL (ref 13.0–17.0)
Lymphocytes Relative: 22.1 % (ref 12.0–46.0)
Lymphs Abs: 1.4 10*3/uL (ref 0.7–4.0)
MCHC: 33.6 g/dL (ref 30.0–36.0)
MCV: 95.6 fl (ref 78.0–100.0)
Monocytes Absolute: 0.5 10*3/uL (ref 0.1–1.0)
Monocytes Relative: 8.1 % (ref 3.0–12.0)
Neutro Abs: 4.1 10*3/uL (ref 1.4–7.7)
Neutrophils Relative %: 65 % (ref 43.0–77.0)
Platelets: 244 10*3/uL (ref 150.0–400.0)
RBC: 4.65 Mil/uL (ref 4.22–5.81)
RDW: 13.8 % (ref 11.5–15.5)
WBC: 6.4 10*3/uL (ref 4.0–10.5)

## 2018-02-09 LAB — BASIC METABOLIC PANEL
BUN: 16 mg/dL (ref 6–23)
CO2: 33 mEq/L — ABNORMAL HIGH (ref 19–32)
Calcium: 9.5 mg/dL (ref 8.4–10.5)
Chloride: 99 mEq/L (ref 96–112)
Creatinine, Ser: 1.01 mg/dL (ref 0.40–1.50)
GFR: 75.83 mL/min (ref >60.00)
Glucose, Bld: 103 mg/dL — ABNORMAL HIGH (ref 70–99)
Potassium: 4.3 mEq/L (ref 3.5–5.1)
Sodium: 138 mEq/L (ref 135–145)

## 2018-02-09 LAB — C-REACTIVE PROTEIN: CRP: 0.2 mg/dL — ABNORMAL LOW (ref 0.5–20.0)

## 2018-02-09 LAB — SEDIMENTATION RATE: Sed Rate: 3 mm/hr (ref 0–20)

## 2018-02-09 LAB — URIC ACID: Uric Acid, Serum: 5.4 mg/dL (ref 4.0–7.8)

## 2018-02-09 MED ORDER — PREDNISONE 20 MG PO TABS
ORAL_TABLET | ORAL | 0 refills | Status: DC
Start: 2018-02-09 — End: 2018-03-09

## 2018-02-09 NOTE — Patient Instructions (Signed)
Do not take any ibuprofen or aleve while taking prednisone.

## 2018-02-09 NOTE — Progress Notes (Signed)
OFFICE VISIT  02/09/2018   CC:  Chief Complaint  Patient presents with  . Hand Pain    right hand joint pain   HPI:    Patient is a 77 y.o. Caucasian male who presents for knuckle pain.  Right hand 5th digit, MCP joint. Onset 1 mo ago after doing some tile work.  Hurting, swelling, some deep pink discoloration.  Pulling sensation over that joint with making a fist.  No other joint problems.  No f/c/malaise or abnormal wt changes. Tried ibuprofen 400 mg bid inconsistently.  He has not rested it consistently, either. Never had this before.  Past Medical History:  Diagnosis Date  . Colon polyps    Repeat TCS on 09/04/2014 showed no polyps and GI MD recommended NO FURTHER COLONOSCOPIES  . Hyperkalemia    ? secondary to ACE-I?  Dose decreased by 50% 02/22/16 and repeat K was in normal range.   . Hyperlipidemia   . Hypertension   . Increased prostate specific antigen (PSA) velocity    2018: on recheck 6 mo later PSA returned to his baseline (<2.0).  Plan on repeat approx 02/2018.  Marland Kitchen Nonmelanoma skin cancer 2009   sees skin MD annually  . Sensorineural hearing loss (SNHL) of both ears 2018   [facility] [place]--hearing aids recommended 02/2017.    Past Surgical History:  Procedure Laterality Date  . COLONOSCOPY  10/12/12015   mild diverticulosis.  No polyps.  No further screening colonoscopies recommended ([facility])  . COLONOSCOPY W/ POLYPECTOMY  06/2009   adenomatous polyps; recall 06/2014 ([facility] [place]  . INGUINAL HERNIA REPAIR Left 2001  . TONSILLECTOMY      Outpatient Medications Prior to Visit  Medication Sig Dispense Refill  . hydrochlorothiazide (MICROZIDE) 12.5 MG capsule Take 1 capsule (12.5 mg total) by mouth daily. 90 capsule 3  . rosuvastatin (CRESTOR) 5 MG tablet TAKE 1 TABLET DAILY 90 tablet 1  . verapamil (CALAN-SR) 240 MG CR tablet Take 1 tablet (240 mg total) by mouth at bedtime. 90 tablet 3  . HAWTHORN PO Take 1,000 mg  by mouth daily.     No facility-administered medications prior to visit.     No Known Allergies  ROS As per HPI  PE: Blood pressure (!) 152/76, pulse 71, temperature (!) 97.5 F (36.4 C), temperature source Oral, resp. rate 18, weight 177 lb (80.3 kg), SpO2 96 %. Gen: Alert, well appearing.  Patient is oriented to person, place, time, and situation. AFFECT: pleasant, lucid thought and speech. Right hand 5th finger MCP joint with mild TTP, erythema, and swelling.  No tenderness over tendons of this finger. Slight limitation in flexion of 5th finger MCP joint.    LABS:    Chemistry      Component Value Date/Time   NA 140 09/08/2017 1206   K 4.7 09/08/2017 1206   CL 101 09/08/2017 1206   CO2 32 09/08/2017 1206   BUN 16 09/08/2017 1206   CREATININE 0.93 09/08/2017 1206      Component Value Date/Time   CALCIUM 9.5 09/08/2017 1206   ALKPHOS 65 02/24/2017 1116   AST 17 02/24/2017 1116   ALT 18 02/24/2017 1116   BILITOT 0.6 02/24/2017 1116      IMPRESSION AND PLAN:  1) Right little finger MCP arthritis. Acute flare of osteoarthritis vs gouty arthritis vs rheum arthritis. I feel gout and inflamm arthr less likely given pt's age, but will proceed with general workup: x-ray 5th digit right hand. Check  Rh factor, CCP, ANA, uric acid, CRP, ESR, BMET, CBC w/diff. Treat with prednisone 45m qd x 5d, then 250mqd x 5d.  Stop NSAIDs for now. Relative rest of affected joint is recommended.  An After Visit Summary was printed and given to the patient.  FOLLOW UP: Return in about 2 weeks (around 02/23/2018) for f/u finger arthritis.   Signed:  PhCrissie SicklesMD           02/09/2018

## 2018-02-10 LAB — CYCLIC CITRUL PEPTIDE ANTIBODY, IGG: Cyclic Citrullin Peptide Ab: <16 UNITS

## 2018-02-10 LAB — RHEUMATOID FACTOR: Rhuematoid fact SerPl-aCnc: <14 IU/mL (ref <14)

## 2018-02-11 ENCOUNTER — Encounter: Payer: Self-pay | Admitting: *Deleted

## 2018-02-11 LAB — ANTINUCLEAR ANTIBODIES, IFA: ANA Titer 1: NEGATIVE

## 2018-03-09 ENCOUNTER — Ambulatory Visit (INDEPENDENT_AMBULATORY_CARE_PROVIDER_SITE_OTHER): Payer: Medicare Other | Admitting: Family Medicine

## 2018-03-09 ENCOUNTER — Encounter: Payer: Self-pay | Admitting: Family Medicine

## 2018-03-09 VITALS — BP 132/74 | HR 68 | Temp 97.9°F | Resp 16 | Ht 69.0 in | Wt 176.0 lb

## 2018-03-09 DIAGNOSIS — R972 Elevated prostate specific antigen [PSA]: Secondary | ICD-10-CM

## 2018-03-09 DIAGNOSIS — M19041 Primary osteoarthritis, right hand: Secondary | ICD-10-CM | POA: Diagnosis not present

## 2018-03-09 DIAGNOSIS — E7800 Pure hypercholesterolemia, unspecified: Secondary | ICD-10-CM

## 2018-03-09 DIAGNOSIS — I10 Essential (primary) hypertension: Secondary | ICD-10-CM

## 2018-03-09 LAB — LIPID PANEL
Cholesterol: 202 mg/dL — ABNORMAL HIGH (ref 0–200)
HDL: 67.6 mg/dL (ref >39.00)
LDL Cholesterol: 118 mg/dL — ABNORMAL HIGH (ref 0–99)
NonHDL: 134.28
Total CHOL/HDL Ratio: 3
Triglycerides: 83 mg/dL (ref 0.0–149.0)
VLDL: 16.6 mg/dL (ref 0.0–40.0)

## 2018-03-09 LAB — PSA: PSA: 2.23 ng/mL (ref 0.10–4.00)

## 2018-03-09 MED ORDER — HYDROCHLOROTHIAZIDE 12.5 MG PO CAPS: 12.5000 mg | ORAL_CAPSULE | Freq: Every day | ORAL | 3 refills | Status: DC

## 2018-03-09 NOTE — Progress Notes (Signed)
OFFICE VISIT  03/09/2018   CC:  Chief Complaint  Patient presents with  . Follow-up    RCI, pt is fasting.    HPI:    Patient is a 77 y.o. Caucasian male who presents for 6 mo f/u HTN, HLD, and recent increased PSA velocity. See PMH for details on this.  Plan for repeat PSA today.  I saw him for acute R 5th digit MCP joint arthritis 02/09/18.  Rheum lab w/u neg. X-ray neg.  I rx'd him a prednisone taper.   He is about 95% better now.  HTN: avg home bp 130/60s, HR 60s.  HLD: rosuva 5mg  qod  Past Medical History:  Diagnosis Date  . Colon polyps    Repeat TCS on 09/04/2014 showed no polyps and GI MD recommended NO FURTHER COLONOSCOPIES  . Hyperkalemia    ? secondary to ACE-I?  Dose decreased by 50% 02/22/16 and repeat K was in normal range.   . Hyperlipidemia   . Hypertension   . Increased prostate specific antigen (PSA) velocity    2018: on recheck 6 mo later PSA returned to his baseline (<2.0).  Plan on repeat approx 02/2018.  Marland Kitchen Nonmelanoma skin cancer 2009   sees skin MD annually  . Sensorineural hearing loss (SNHL) of both ears 2018   [facility] [place]--hearing aids recommended 02/2017.    Past Surgical History:  Procedure Laterality Date  . COLONOSCOPY  10/12/12015   mild diverticulosis.  No polyps.  No further screening colonoscopies recommended ([facility])  . COLONOSCOPY W/ POLYPECTOMY  06/2009   adenomatous polyps; recall 06/2014 ([facility] [place]  . INGUINAL HERNIA REPAIR Left 2001  . TONSILLECTOMY      Outpatient Medications Prior to Visit  Medication Sig Dispense Refill  . rosuvastatin (CRESTOR) 5 MG tablet TAKE 1 TABLET DAILY 90 tablet 1  . verapamil (CALAN-SR) 240 MG CR tablet Take 1 tablet (240 mg total) by mouth at bedtime. 90 tablet 3  . hydrochlorothiazide (MICROZIDE) 12.5 MG capsule Take 1 capsule (12.5 mg total) by mouth daily. 90 capsule 3  . predniSONE (DELTASONE) 20 MG tablet 2 tabs po qd x 5d, then 1 tab po qd  x 5d (Patient not taking: Reported on 03/09/2018) 15 tablet 0   No facility-administered medications prior to visit.     No Known Allergies  ROS As per HPI  PE: Blood pressure 132/74, pulse 68, temperature 97.9 F (36.6 C), temperature source Oral, resp. rate 16, height 5\' 9"  (1.753 m), weight 176 lb (79.8 kg), SpO2 96 %. Body mass index is 25.99 kg/m.  Gen: Alert, well appearing.  Patient is oriented to person, place, time, and situation. AFFECT: pleasant, lucid thought and speech. CV: RRR, no m/r/g.   LUNGS: CTA bilat, nonlabored resps, good aeration in all lung fields. EXT: no clubbing, cyanosis, or edema.  Right hand: no MCP swelling, erythema, or tenderness.  ROM of all fingers intact.  LABS:  Lab Results  Component Value Date   TSH 3.14 02/19/2016   Lab Results  Component Value Date   WBC 6.4 02/09/2018   HGB 15.0 02/09/2018   HCT 44.5 02/09/2018   MCV 95.6 02/09/2018   PLT 244.0 02/09/2018   Lab Results  Component Value Date   CREATININE 1.01 02/09/2018   BUN 16 02/09/2018   NA 138 02/09/2018   K 4.3 02/09/2018   CL 99 02/09/2018   CO2 33 (H) 02/09/2018   Lab Results  Component Value Date   ALT  18 02/24/2017   AST 17 02/24/2017   ALKPHOS 65 02/24/2017   BILITOT 0.6 02/24/2017   Lab Results  Component Value Date   CHOL 194 02/24/2017   Lab Results  Component Value Date   HDL 62.50 02/24/2017   Lab Results  Component Value Date   LDLCALC 104 (H) 02/24/2017   Lab Results  Component Value Date   TRIG 133.0 02/24/2017   Lab Results  Component Value Date   CHOLHDL 3 02/24/2017   Lab Results  Component Value Date   PSA 2.16 02/24/2017   PSA 1.51 02/19/2016   PSA 1.56 02/20/2015   IMPRESSION AND PLAN:  1) HTN: The current medical regimen is effective;  continue present plan and medications. Lytes/cr recently normal 02/09/18 when here for acute arthritis visit.  2) Hypercholesterolemia: tolerating statin.  FLP check today.  3) Recent hx  of increased PSA velocity.  This did return down to his baseline level on recheck x 1: recheck PSA today. +FH prostate ca in brother (when in his 48s?).  Pt very worried about this and wants to be sure it has stayed stable.  4) Acute arthritis of R hand 5th digit MCP joint:  Resolved with a course of prednisone. Rheum labs normal, x-ray normal. Reassured pt.  An After Visit Summary was printed and given to the patient.  FOLLOW UP: Return in about 6 months (around 09/08/2018) for routine chronic illness f/u.  Signed:  Crissie Sickles, MD           03/09/2018

## 2018-03-11 ENCOUNTER — Telehealth: Payer: Self-pay | Admitting: Family Medicine

## 2018-03-11 NOTE — Telephone Encounter (Signed)
Pt declined 2019 AWV °

## 2018-06-05 ENCOUNTER — Other Ambulatory Visit: Payer: Self-pay | Admitting: Family Medicine

## 2018-06-08 ENCOUNTER — Telehealth: Payer: Self-pay

## 2018-06-08 NOTE — Telephone Encounter (Signed)
Can they get a different extended release verapamil at same or similar dosing as his Calan (240 mg once a day)?

## 2018-06-08 NOTE — Telephone Encounter (Signed)
Fax received from pharmacy stating that Calan SR is mfr backordered. They are asking for an alternative therapy.

## 2018-06-10 NOTE — Telephone Encounter (Signed)
On 06/08/18 Pharmacy called and was transferred several times. Placed on hold for over ten minutes and then was disconnected, will try to call back at later time.

## 2018-06-11 ENCOUNTER — Other Ambulatory Visit: Payer: Self-pay

## 2018-06-11 ENCOUNTER — Telehealth: Payer: Self-pay | Admitting: Family Medicine

## 2018-06-11 MED ORDER — VERAPAMIL HCL ER 120 MG PO TBCR: 120.0000 mg | EXTENDED_RELEASE_TABLET | Freq: Every day | ORAL | 1 refills | Status: DC

## 2018-06-11 NOTE — Telephone Encounter (Signed)
Copied from [place] [phone]. Topic: General - Other >> Jun 11, 2018  8:47 AM Lennox Solders wrote: Reason for CRM: stephanie pharm tech [facility] is calling the verapamil is on back order and they do not know when medication will be off back order. Colletta [place] would like to know what medication the md would like to change to. Please call [phone] reference number [id number]

## 2018-06-11 NOTE — Telephone Encounter (Signed)
Medication changed to Calan 120 mg bid #180 with 1 RF per Dr. Anitra Lauth. Pharmacy notified.

## 2018-06-11 NOTE — Telephone Encounter (Signed)
Please advise. Thanks.  

## 2018-06-11 NOTE — Telephone Encounter (Signed)
Pls see 06/08/18 telephone note Mickel Baas and me).  Will you pick up where she left off?-thx

## 2018-06-11 NOTE — Telephone Encounter (Signed)
See note, medication changed and phoned in.

## 2018-06-15 ENCOUNTER — Encounter: Payer: Self-pay | Admitting: Family Medicine

## 2018-06-15 DIAGNOSIS — D485 Neoplasm of uncertain behavior of skin: Secondary | ICD-10-CM | POA: Diagnosis not present

## 2018-06-15 DIAGNOSIS — L821 Other seborrheic keratosis: Secondary | ICD-10-CM | POA: Diagnosis not present

## 2018-06-15 DIAGNOSIS — D0439 Carcinoma in situ of skin of other parts of face: Secondary | ICD-10-CM | POA: Diagnosis not present

## 2018-06-15 DIAGNOSIS — Z85828 Personal history of other malignant neoplasm of skin: Secondary | ICD-10-CM | POA: Diagnosis not present

## 2018-06-15 DIAGNOSIS — L814 Other melanin hyperpigmentation: Secondary | ICD-10-CM | POA: Diagnosis not present

## 2018-06-15 DIAGNOSIS — L570 Actinic keratosis: Secondary | ICD-10-CM | POA: Diagnosis not present

## 2018-06-15 DIAGNOSIS — D1801 Hemangioma of skin and subcutaneous tissue: Secondary | ICD-10-CM | POA: Diagnosis not present

## 2018-07-18 IMAGING — DX DG FINGER LITTLE 2+V*R*
3 series · 3 of 3 positions shown · non-contrast
Comparison: None.

CLINICAL DATA: MCP pain and swelling 1 month

EXAM:
RIGHT LITTLE FINGER 2+V

[finger ap]
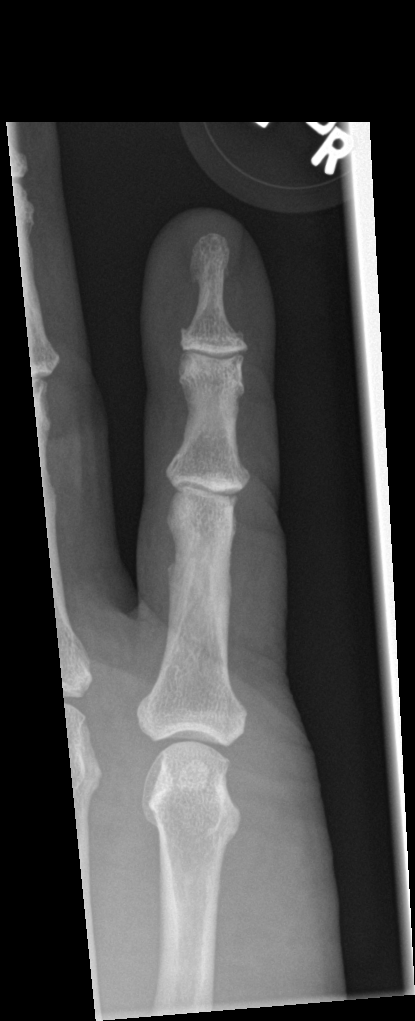

[finger obl]
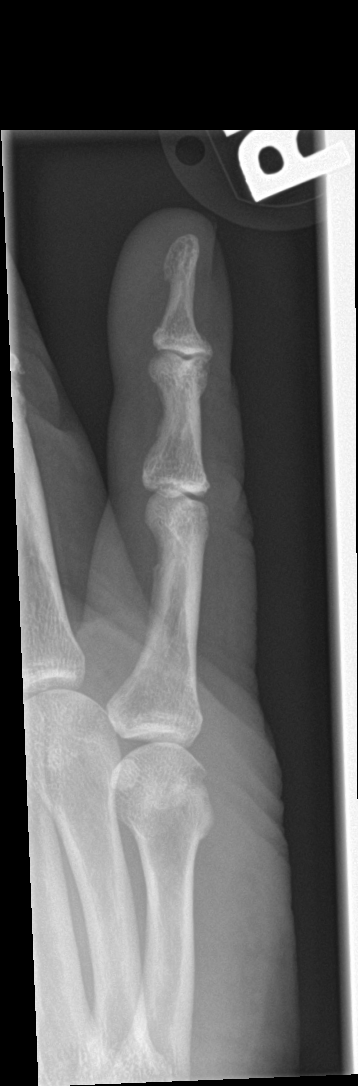

[finger lat]
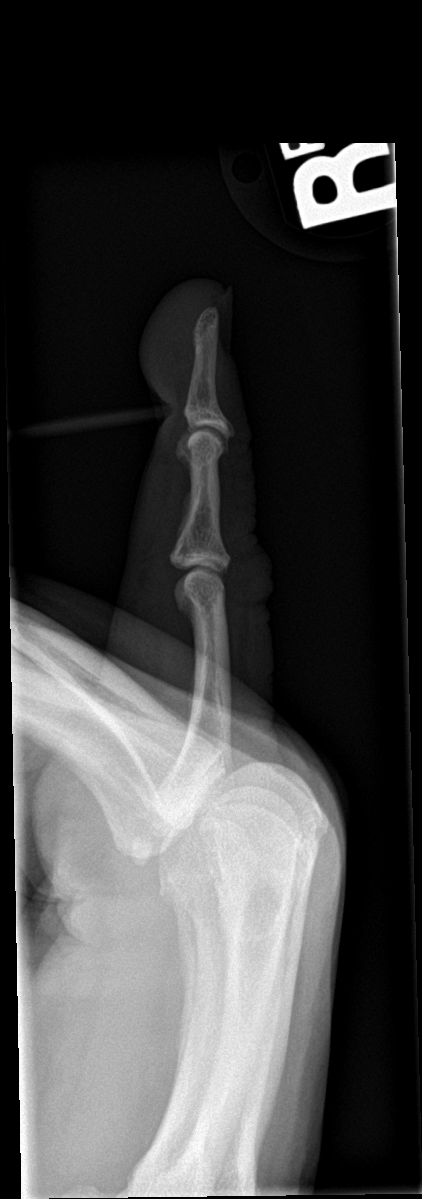

[3 of 3 positions shown; findings below may reference images not displayed]

FINDINGS: There is no evidence of fracture or dislocation. There is no
evidence of arthropathy or other focal bone abnormality. Soft
tissues are unremarkable.
IMPRESSION: Negative.

## 2018-07-19 DIAGNOSIS — D0439 Carcinoma in situ of skin of other parts of face: Secondary | ICD-10-CM | POA: Diagnosis not present

## 2018-09-07 ENCOUNTER — Encounter: Payer: Self-pay | Admitting: Family Medicine

## 2018-09-07 ENCOUNTER — Ambulatory Visit (INDEPENDENT_AMBULATORY_CARE_PROVIDER_SITE_OTHER): Payer: Medicare Other | Admitting: Family Medicine

## 2018-09-07 VITALS — BP 148/71 | HR 64 | Temp 97.6°F | Resp 16 | Ht 69.0 in | Wt 172.5 lb

## 2018-09-07 DIAGNOSIS — I10 Essential (primary) hypertension: Secondary | ICD-10-CM

## 2018-09-07 DIAGNOSIS — E7800 Pure hypercholesterolemia, unspecified: Secondary | ICD-10-CM | POA: Diagnosis not present

## 2018-09-07 DIAGNOSIS — E663 Overweight: Secondary | ICD-10-CM

## 2018-09-07 DIAGNOSIS — R972 Elevated prostate specific antigen [PSA]: Secondary | ICD-10-CM

## 2018-09-07 LAB — COMPREHENSIVE METABOLIC PANEL
ALT: 13 U/L (ref 0–53)
AST: 15 U/L (ref 0–37)
Albumin: 4.3 g/dL (ref 3.5–5.2)
Alkaline Phosphatase: 66 U/L (ref 39–117)
BUN: 22 mg/dL (ref 6–23)
CO2: 34 mEq/L — ABNORMAL HIGH (ref 19–32)
Calcium: 9.7 mg/dL (ref 8.4–10.5)
Chloride: 103 mEq/L (ref 96–112)
Creatinine, Ser: 1.04 mg/dL (ref 0.40–1.50)
GFR: 73.2 mL/min (ref >60.00)
Glucose, Bld: 107 mg/dL — ABNORMAL HIGH (ref 70–99)
Potassium: 4.4 mEq/L (ref 3.5–5.1)
Sodium: 141 mEq/L (ref 135–145)
Total Bilirubin: 0.7 mg/dL (ref 0.2–1.2)
Total Protein: 6.7 g/dL (ref 6.0–8.3)

## 2018-09-07 LAB — LIPID PANEL
Cholesterol: 196 mg/dL (ref 0–200)
HDL: 64.9 mg/dL (ref >39.00)
LDL Cholesterol: 110 mg/dL — ABNORMAL HIGH (ref 0–99)
NonHDL: 131.17
Total CHOL/HDL Ratio: 3
Triglycerides: 107 mg/dL (ref 0.0–149.0)
VLDL: 21.4 mg/dL (ref 0.0–40.0)

## 2018-09-07 MED ORDER — VERAPAMIL HCL ER 120 MG PO TBCR: EXTENDED_RELEASE_TABLET | ORAL | 1 refills | Status: DC

## 2018-09-07 NOTE — Progress Notes (Signed)
Office Note 09/07/2018  CC:  Chief Complaint  Patient presents with  . Follow-up    RCI, pt is fasting.     HPI:  Curtis Matthews is a 77 y.o. White male who is 6 mo f/u HTN, HLD, hx of increased PSA velocity. Feeling well.  No acute complaints.  HTN: avg <130/80.  Occ systolic in the 235T.  HLD: taking statin daily.  Trying to improve on fruit and veggie intake. Salt and fast food avoidance. Goes to GYM 2-3 times per week: light wt's and 30 min cardio  Inc psa velocity (from 01/2016 check to the 02/2017 check): last visit 6 mo ago we repeated his PSA and it was stable compared to 1 yr prior.  Plan on recheck at next f/u in 6 months. Pt has FH of prostate ca (brother, approx 50 yrs old) and he is very worried about this, hence our prolonged screening.  ROS: no CP, no SOB, no wheezing, no cough, no dizziness, no HAs, no rashes, no melena/hematochezia.  No polyuria or polydipsia.  No myalgias or arthralgias.  Past Medical History:  Diagnosis Date  . Colon polyps    Repeat TCS on 09/04/2014 showed no polyps and GI MD recommended NO FURTHER COLONOSCOPIES  . Hyperkalemia    ? secondary to ACE-I?  Dose decreased by 50% 02/22/16 and repeat K was in normal range.   . Hyperlipidemia   . Hypertension   . Increased prostate specific antigen (PSA) velocity    2018: on recheck 6 mo later PSA returned to his baseline (<2.0).  Repeat 02/2018 stable.    . Nonmelanoma skin cancer 2009   sees skin MD annually  . Sensorineural hearing loss (SNHL) of both ears 2018   [facility] [place]--hearing aids recommended 02/2017.    Past Surgical History:  Procedure Laterality Date  . COLONOSCOPY  10/12/12015   mild diverticulosis.  No polyps.  No further screening colonoscopies recommended ([facility])  . COLONOSCOPY W/ POLYPECTOMY  06/2009   adenomatous polyps; recall 06/2014 [facility] [place]  . INGUINAL HERNIA REPAIR Left 2001  . TONSILLECTOMY      Family  History  Problem Relation Age of Onset  . Heart disease Mother   . Hypertension Mother     Social History   Socioeconomic History  . Marital status: Married    Spouse name: Not on file  . Number of children: Not on file  . Years of education: Not on file  . Highest education level: Not on file  Occupational History  . Not on file  Social Needs  . Financial resource strain: Not on file  . Food insecurity:    Worry: Not on file    Inability: Not on file  . Transportation needs:    Medical: Not on file    Non-medical: Not on file  Tobacco Use  . Smoking status: Never Smoker  . Smokeless tobacco: Never Used  Substance and Sexual Activity  . Alcohol use: Yes    Alcohol/week: 1.0 standard drinks    Types: 1 Glasses of wine per week    Comment: glass of wine daily  . Drug use: No  . Sexual activity: Not on file  Lifestyle  . Physical activity:    Days per week: Not on file    Minutes per session: Not on file  . Stress: Not on file  Relationships  . Social connections:    Talks on phone: Not on file    Gets together:  Not on file    Attends religious service: Not on file    Active member of club or organization: Not on file    Attends meetings of clubs or organizations: Not on file    Relationship status: Not on file  . Intimate partner violence:    Fear of current or ex partner: Not on file    Emotionally abused: Not on file    Physically abused: Not on file    Forced sexual activity: Not on file  Other Topics Concern  . Not on file  Social History Narrative   Married, has one adopted son.   Occupation:  Retired Chief Executive Officer for [employer].   Never smoker, alcohol--1-2 glasses of wine per day.     No hx of alc abuse or drug use.    Outpatient Medications Prior to Visit  Medication Sig Dispense Refill  . hydrochlorothiazide (MICROZIDE) 12.5 MG capsule Take 1 capsule (12.5 mg total) by mouth daily. 90 capsule 3  . rosuvastatin (CRESTOR) 5 MG tablet TAKE  1 TABLET DAILY 90 tablet 1  . verapamil (CALAN-SR) 120 MG CR tablet Take 1 tablet (120 mg total) by mouth at bedtime. (Patient taking differently: Take 120 mg by mouth 2 (two) times daily. ) 180 tablet 1  . verapamil (CALAN-SR) 240 MG CR tablet Take 1 tablet (240 mg total) by mouth at bedtime. (Patient not taking: Reported on 09/07/2018) 90 tablet 3   No facility-administered medications prior to visit.     No Known Allergies  PE; Blood pressure (!) 148/71, pulse 64, temperature 97.6 F (36.4 C), temperature source Oral, resp. rate 16, height 5\' 9"  (1.753 m), weight 172 lb 8 oz (78.2 kg), SpO2 96 %. Gen: Alert, well appearing.  Patient is oriented to person, place, time, and situation. AFFECT: pleasant, lucid thought and speech. CV: RRR, no m/r/g.   LUNGS: CTA bilat, nonlabored resps, good aeration in all lung fields. EXT: no clubbing or cyanosis.  no edema.   Pertinent labs:  Lab Results  Component Value Date   TSH 3.14 02/19/2016   Lab Results  Component Value Date   WBC 6.4 02/09/2018   HGB 15.0 02/09/2018   HCT 44.5 02/09/2018   MCV 95.6 02/09/2018   PLT 244.0 02/09/2018   Lab Results  Component Value Date   CREATININE 1.01 02/09/2018   BUN 16 02/09/2018   NA 138 02/09/2018   K 4.3 02/09/2018   CL 99 02/09/2018   CO2 33 (H) 02/09/2018   Lab Results  Component Value Date   ALT 18 02/24/2017   AST 17 02/24/2017   ALKPHOS 65 02/24/2017   BILITOT 0.6 02/24/2017   Lab Results  Component Value Date   CHOL 202 (H) 03/09/2018   Lab Results  Component Value Date   HDL 67.60 03/09/2018   Lab Results  Component Value Date   LDLCALC 118 (H) 03/09/2018   Lab Results  Component Value Date   TRIG 83.0 03/09/2018   Lab Results  Component Value Date   CHOLHDL 3 03/09/2018   Lab Results  Component Value Date   PSA 2.23 03/09/2018   PSA 2.16 02/24/2017   PSA 1.51 02/19/2016   No results found for: HGBA1C   ASSESSMENT AND PLAN:   1) HTN: The current  medical regimen is effective;  continue present plan and medications. Lytes/Cr today.  2) HLD: tolerating statin.  Hepatic panel and FLP today.  3) Increasd PSA velocity: plan recheck PSA at next f/u  in 6 mo (this will be an annual recheck)--see HPI for details.  An After Visit Summary was printed and given to the patient.  FOLLOW UP:  Return in about 6 months (around 03/09/2019) for routine chronic illness f/u (PSA check at that time).  Signed:  Crissie Sickles, MD           09/07/2018

## 2018-12-13 ENCOUNTER — Other Ambulatory Visit: Payer: Self-pay | Admitting: *Deleted

## 2018-12-13 MED ORDER — VERAPAMIL HCL ER 120 MG PO TBCR: EXTENDED_RELEASE_TABLET | ORAL | 1 refills | Status: DC

## 2018-12-28 DIAGNOSIS — L814 Other melanin hyperpigmentation: Secondary | ICD-10-CM | POA: Diagnosis not present

## 2018-12-28 DIAGNOSIS — L570 Actinic keratosis: Secondary | ICD-10-CM | POA: Diagnosis not present

## 2018-12-28 DIAGNOSIS — L821 Other seborrheic keratosis: Secondary | ICD-10-CM | POA: Diagnosis not present

## 2019-02-12 ENCOUNTER — Other Ambulatory Visit: Payer: Self-pay | Admitting: Family Medicine

## 2019-03-15 ENCOUNTER — Ambulatory Visit: Payer: Medicare Other | Admitting: Family Medicine

## 2019-04-28 ENCOUNTER — Ambulatory Visit (INDEPENDENT_AMBULATORY_CARE_PROVIDER_SITE_OTHER): Payer: Medicare Other | Admitting: Family Medicine

## 2019-04-28 ENCOUNTER — Other Ambulatory Visit: Payer: Self-pay

## 2019-04-28 ENCOUNTER — Encounter: Payer: Self-pay | Admitting: Family Medicine

## 2019-04-28 VITALS — BP 130/71 | HR 67

## 2019-04-28 DIAGNOSIS — I10 Essential (primary) hypertension: Secondary | ICD-10-CM

## 2019-04-28 DIAGNOSIS — E7800 Pure hypercholesterolemia, unspecified: Secondary | ICD-10-CM | POA: Diagnosis not present

## 2019-04-28 DIAGNOSIS — R972 Elevated prostate specific antigen [PSA]: Secondary | ICD-10-CM

## 2019-04-28 NOTE — Progress Notes (Signed)
Virtual Visit via Video Note  I connected with Curtis Matthews on 04/28/19 at  1:40 PM EDT by a video enabled telemedicine application that had to be converted to telephone encounter due to technical problems with the telemedicine platform-- and verified that I am speaking with the correct person using two identifiers.  Location patient: home Location provider:work or home office Persons participating in the virtual visit: patient, provider  I discussed the limitations of evaluation and management by telemedicine/telephone and the availability of in person appointments. The patient expressed understanding and agreed to proceed.  Telemedicine/telephone visit is a necessity given the COVID-19 restrictions in place at the current time.  HPI: 78 y/o WM being seen today for 8 mo f/u HTN, HLD, and increased PSA velocity. He is doing well, no acute complaints.   Staying active and eating well.  HTN: home bp's consistently normal.  HLD: tolerating statin w/out side effect.  Inc psa velocity: (from 01/2016 check to the 02/2017 check): last visit 14 mo ago we repeated his PSA and it was stable compared to 1 yr prior.  Plan is to recheck this now. (Curtis Matthews has FH of prostate ca (brother, approx 58 yrs old) and he is very worried about this, hence our prolonged screening.)  ROS: no CP, no SOB, no wheezing, no cough, no dizziness, no HAs, no rashes, no melena/hematochezia.  No polyuria or polydipsia.  No myalgias or arthralgias.   Past Medical History:  Diagnosis Date  . Colon polyps    Repeat TCS on 09/04/2014 showed no polyps and GI MD recommended NO FURTHER COLONOSCOPIES  . Hyperkalemia    ? secondary to ACE-I?  Dose decreased by 50% 02/22/16 and repeat K was in normal range.   . Hyperlipidemia   . Hypertension   . Increased prostate specific antigen (PSA) velocity    2018: on recheck 6 mo later PSA returned to his baseline (<2.0).  Repeat 02/2018 stable.    . Nonmelanoma skin cancer 2009   sees skin MD  annually  . Sensorineural hearing loss (SNHL) of both ears 2018   [facility]--hearing aids recommended 02/2017.    Past Surgical History:  Procedure Laterality Date  . COLONOSCOPY  10/12/12015   mild diverticulosis.  No polyps.  No further screening colonoscopies recommended ([facility])  . COLONOSCOPY W/ POLYPECTOMY  06/2009   adenomatous polyps; recall 06[facility] [place]  . INGUINAL HERNIA REPAIR Left 2001  . TONSILLECTOMY      Family History  Problem Relation Age of Onset  . Heart disease Mother   . Hypertension Mother     SOCIAL HX: Married, 1 adopted son.  Retired Chief Executive Officer for [employer].  No tob.  Wine 1 glass/day.   Current Outpatient Medications:  .  ELDERBERRY PO, Take by mouth. Take 1 gummy daily., Disp: , Rfl:  .  hydrochlorothiazide (MICROZIDE) 12.5 MG capsule, TAKE 1 CAPSULE DAILY, Disp: 90 capsule, Rfl: 3 .  rosuvastatin (CRESTOR) 5 MG tablet, TAKE 1 TABLET DAILY, Disp: 90 tablet, Rfl: 1 .  verapamil (CALAN-SR) 120 MG CR tablet, 2 tabs po qd, Disp: 180 tablet, Rfl: 1  EXAM:  VITALS per patient if applicable: BP 106/26 (BP Location: Left Arm, Patient Position: Sitting, Cuff Size: Normal)   Pulse 67    GENERAL: alert, oriented, appears well and in no acute distress  HEENT: atraumatic, conjunttiva clear, no obvious abnormalities on inspection of external nose and ears  NECK: normal movements of the head and neck  LUNGS: on inspection no signs of respiratory distress, breathing rate appears normal, no obvious gross SOB, gasping or wheezing  CV: no obvious cyanosis  MS: moves all visible extremities without noticeable abnormality  PSYCH/NEURO: pleasant and cooperative, no obvious depression or anxiety, speech and thought processing grossly intact  LABS: none today  Lab Results  Component Value Date   PSA 2.23 03/09/2018   PSA 2.16 02/24/2017   PSA 1.51 02/19/2016     Chemistry       Component Value Date/Time   NA 141 09/07/2018 1004   K 4.4 09/07/2018 1004   CL 103 09/07/2018 1004   CO2 34 (H) 09/07/2018 1004   BUN 22 09/07/2018 1004   CREATININE 1.04 09/07/2018 1004      Component Value Date/Time   CALCIUM 9.7 09/07/2018 1004   ALKPHOS 66 09/07/2018 1004   AST 15 09/07/2018 1004   ALT 13 09/07/2018 1004   BILITOT 0.7 09/07/2018 1004     Lab Results  Component Value Date   CHOL 196 09/07/2018   HDL 64.90 09/07/2018   LDLCALC 110 (H) 09/07/2018   TRIG 107.0 09/07/2018   CHOLHDL 3 09/07/2018   Lab Results  Component Value Date   WBC 6.4 02/09/2018   HGB 15.0 02/09/2018   HCT 44.5 02/09/2018   MCV 95.6 02/09/2018   PLT 244.0 02/09/2018   Lab Results  Component Value Date   TSH 3.14 02/19/2016    ASSESSMENT AND PLAN:  Discussed the following assessment and plan:  1) HTN: The current medical regimen is effective;  continue present plan and medications. Lytes/cr --future.  2) HLD: tolerating statin. FLP and hepatic panel--future.  3) Increased PSA velocity: recheck PSA--future.   I discussed the assessment and treatment plan with the patient. The patient was provided an opportunity to ask questions and all were answered. The patient agreed with the plan and demonstrated an understanding of the instructions.   The patient was advised to call back or seek an in-person evaluation if the symptoms worsen or if the condition fails to improve as anticipated.  Spent 10 min with Curtis Matthews today, with >50% of this time spent in counseling and care coordination regarding the above problems.  F/u: 6 mo RCI, lab visit at his earliest convenience.  Signed:  Crissie Sickles, MD           04/28/2019

## 2019-05-03 ENCOUNTER — Ambulatory Visit (INDEPENDENT_AMBULATORY_CARE_PROVIDER_SITE_OTHER): Payer: Medicare Other

## 2019-05-03 ENCOUNTER — Other Ambulatory Visit: Payer: Self-pay

## 2019-05-03 DIAGNOSIS — E7800 Pure hypercholesterolemia, unspecified: Secondary | ICD-10-CM | POA: Diagnosis not present

## 2019-05-03 DIAGNOSIS — I10 Essential (primary) hypertension: Secondary | ICD-10-CM | POA: Diagnosis not present

## 2019-05-03 DIAGNOSIS — R972 Elevated prostate specific antigen [PSA]: Secondary | ICD-10-CM

## 2019-05-03 LAB — LIPID PANEL
Cholesterol: 195 mg/dL (ref 0–200)
HDL: 53.6 mg/dL (ref >39.00)
LDL Cholesterol: 122 mg/dL — ABNORMAL HIGH (ref 0–99)
NonHDL: 141.76
Total CHOL/HDL Ratio: 4
Triglycerides: 97 mg/dL (ref 0.0–149.0)
VLDL: 19.4 mg/dL (ref 0.0–40.0)

## 2019-05-03 LAB — COMPREHENSIVE METABOLIC PANEL
ALT: 13 U/L (ref 0–53)
AST: 15 U/L (ref 0–37)
Albumin: 4 g/dL (ref 3.5–5.2)
Alkaline Phosphatase: 61 U/L (ref 39–117)
BUN: 21 mg/dL (ref 6–23)
CO2: 32 mEq/L (ref 19–32)
Calcium: 9.3 mg/dL (ref 8.4–10.5)
Chloride: 104 mEq/L (ref 96–112)
Creatinine, Ser: 0.97 mg/dL (ref 0.40–1.50)
GFR: 74.52 mL/min (ref >60.00)
Glucose, Bld: 113 mg/dL — ABNORMAL HIGH (ref 70–99)
Potassium: 4.7 mEq/L (ref 3.5–5.1)
Sodium: 141 mEq/L (ref 135–145)
Total Bilirubin: 0.4 mg/dL (ref 0.2–1.2)
Total Protein: 6.2 g/dL (ref 6.0–8.3)

## 2019-05-03 LAB — PSA, MEDICARE: PSA: 2.08 ng/ml (ref 0.10–4.00)

## 2019-05-28 ENCOUNTER — Other Ambulatory Visit: Payer: Self-pay | Admitting: Family Medicine

## 2019-08-16 DIAGNOSIS — Z85828 Personal history of other malignant neoplasm of skin: Secondary | ICD-10-CM | POA: Diagnosis not present

## 2019-08-16 DIAGNOSIS — L814 Other melanin hyperpigmentation: Secondary | ICD-10-CM | POA: Diagnosis not present

## 2019-08-16 DIAGNOSIS — L578 Other skin changes due to chronic exposure to nonionizing radiation: Secondary | ICD-10-CM | POA: Diagnosis not present

## 2019-08-25 DIAGNOSIS — Z23 Encounter for immunization: Secondary | ICD-10-CM | POA: Diagnosis not present

## 2019-10-04 DIAGNOSIS — H903 Sensorineural hearing loss, bilateral: Secondary | ICD-10-CM | POA: Diagnosis not present

## 2019-10-04 DIAGNOSIS — H9113 Presbycusis, bilateral: Secondary | ICD-10-CM | POA: Diagnosis not present

## 2019-10-04 DIAGNOSIS — H838X3 Other specified diseases of inner ear, bilateral: Secondary | ICD-10-CM | POA: Diagnosis not present

## 2019-10-16 ENCOUNTER — Other Ambulatory Visit: Payer: Self-pay | Admitting: Family Medicine

## 2019-10-26 DIAGNOSIS — I6523 Occlusion and stenosis of bilateral carotid arteries: Secondary | ICD-10-CM | POA: Diagnosis not present

## 2019-10-26 DIAGNOSIS — R0989 Other specified symptoms and signs involving the circulatory and respiratory systems: Secondary | ICD-10-CM | POA: Diagnosis not present

## 2019-10-26 HISTORY — PX: US CAROTID DOPPLER BILATERAL (ARMC HX): HXRAD1402

## 2019-11-01 ENCOUNTER — Encounter: Payer: Self-pay | Admitting: Family Medicine

## 2019-11-01 ENCOUNTER — Ambulatory Visit (INDEPENDENT_AMBULATORY_CARE_PROVIDER_SITE_OTHER): Payer: Medicare Other | Admitting: Family Medicine

## 2019-11-01 ENCOUNTER — Other Ambulatory Visit: Payer: Self-pay

## 2019-11-01 VITALS — BP 154/84 | HR 67 | Temp 98.1°F | Resp 16 | Ht 69.0 in | Wt 173.2 lb

## 2019-11-01 DIAGNOSIS — H9312 Tinnitus, left ear: Secondary | ICD-10-CM

## 2019-11-01 DIAGNOSIS — R972 Elevated prostate specific antigen [PSA]: Secondary | ICD-10-CM | POA: Diagnosis not present

## 2019-11-01 DIAGNOSIS — I10 Essential (primary) hypertension: Secondary | ICD-10-CM

## 2019-11-01 DIAGNOSIS — E7800 Pure hypercholesterolemia, unspecified: Secondary | ICD-10-CM | POA: Diagnosis not present

## 2019-11-01 DIAGNOSIS — R7301 Impaired fasting glucose: Secondary | ICD-10-CM

## 2019-11-01 HISTORY — DX: Impaired fasting glucose: R73.01

## 2019-11-01 LAB — BASIC METABOLIC PANEL
BUN: 16 mg/dL (ref 6–23)
CO2: 32 mEq/L (ref 19–32)
Calcium: 9.5 mg/dL (ref 8.4–10.5)
Chloride: 102 mEq/L (ref 96–112)
Creatinine, Ser: 0.92 mg/dL (ref 0.40–1.50)
GFR: 79.11 mL/min (ref >60.00)
Glucose, Bld: 105 mg/dL — ABNORMAL HIGH (ref 70–99)
Potassium: 4.9 mEq/L (ref 3.5–5.1)
Sodium: 140 mEq/L (ref 135–145)

## 2019-11-01 NOTE — Progress Notes (Signed)
OFFICE VISIT  11/01/2019   CC:  Chief Complaint  Patient presents with  . Follow-up    RCI, pt is fasting   HPI:    Patient is a 78 y.o. Caucasian male who presents for 6 mo f/u HTN, HLD, increased PSA velocity. Last three annual PSA measurments have been stable in normal range.  HTN: home bp's low-mid 130s/75. Exercise level down a lot.  HLD: tolerating statin. Diet fair.  About 1 mo ago, ringing in L ear, chronic bilat hearing loss.  ENT f/u for this last month; carotid dopplers done and these were unremarkable.  Pt wants to take watchful waiting approach. Ringing improved some lately.  He is considering getting hearing aids.   ROS: no CP, no SOB, no wheezing, no cough, no dizziness, no HAs, no rashes, no melena/hematochezia.  No polyuria or polydipsia.  No myalgias or arthralgias.   Past Medical History:  Diagnosis Date  . Colon polyps    Repeat TCS on 09/04/2014 showed no polyps and GI MD recommended NO FURTHER COLONOSCOPIES  . Hyperkalemia    ? secondary to ACE-I?  Dose decreased by 50% 02/22/16 and repeat K was in normal range.   . Hyperlipidemia   . Hypertension   . Increased prostate specific antigen (PSA) velocity    2018: on recheck 6 mo later PSA returned to his baseline (<2.0).  Repeat 02/2018 stable.    . Nonmelanoma skin cancer 2009   sees skin MD annually  . Sensorineural hearing loss (SNHL) of both ears 2018   [facility]--hearing aids recommended 02/2017.    Past Surgical History:  Procedure Laterality Date  . COLONOSCOPY  10/12/12015   mild diverticulosis.  No polyps.  No further screening colonoscopies recommended ([facility])  . COLONOSCOPY W/ POLYPECTOMY  06/2009   adenomatous polyps; recall 06/2014 ([facility] [place]  . INGUINAL HERNIA REPAIR Left 2001  . TONSILLECTOMY      Outpatient Medications Prior to Visit  Medication Sig Dispense Refill  . ASPIRIN 81 PO Take 81 mg by mouth daily.    Marland Kitchen  ELDERBERRY PO Take by mouth. Take 1 gummy daily.    . hydrochlorothiazide (MICROZIDE) 12.5 MG capsule TAKE 1 CAPSULE DAILY 90 capsule 3  . rosuvastatin (CRESTOR) 5 MG tablet TAKE 1 TABLET DAILY 90 tablet 0  . verapamil (CALAN-SR) 120 MG CR tablet TAKE 2 TABLETS DAILY 180 tablet 1  . VITAMIN D PO Take by mouth daily.     No facility-administered medications prior to visit.     No Known Allergies  ROS As per HPI  PE: Initial bp today 164/80 automated cuff.  Repeat manual cuff at end of visit was 154/84 Blood pressure (!) 154/84, pulse 67, temperature 98.1 F (36.7 C), temperature source Temporal, resp. rate 16, height 5\' 9"  (1.753 m), weight 173 lb 3.2 oz (78.6 kg), SpO2 98 %. Body mass index is 25.58 kg/m.  Gen: Alert, well appearing.  Patient is oriented to person, place, time, and situation. AFFECT: pleasant, lucid thought and speech. CV: RRR, no m/r/g.   LUNGS: CTA bilat, nonlabored resps, good aeration in all lung fields. EXT: no clubbing or cyanosis.  no edema.     LABS:  Lab Results  Component Value Date   TSH 3.14 02/19/2016   Lab Results  Component Value Date   WBC 6.4 02/09/2018   HGB 15.0 02/09/2018   HCT 44.5 02/09/2018   MCV 95.6 02/09/2018   PLT 244.0 02/09/2018   Lab  Results  Component Value Date   CREATININE 0.97 05/03/2019   BUN 21 05/03/2019   NA 141 05/03/2019   K 4.7 05/03/2019   CL 104 05/03/2019   CO2 32 05/03/2019   Lab Results  Component Value Date   ALT 13 05/03/2019   AST 15 05/03/2019   ALKPHOS 61 05/03/2019   BILITOT 0.4 05/03/2019   Lab Results  Component Value Date   CHOL 195 05/03/2019   Lab Results  Component Value Date   HDL 53.60 05/03/2019   Lab Results  Component Value Date   LDLCALC 122 (H) 05/03/2019   Lab Results  Component Value Date   TRIG 97.0 05/03/2019   Lab Results  Component Value Date   CHOLHDL 4 05/03/2019   Lab Results  Component Value Date   PSA 2.08 05/03/2019   PSA 2.23 03/09/2018   PSA  2.16 02/24/2017    IMPRESSION AND PLAN:  1) HTN: bp up here but pt says consistently normal at home with regular monitoring. No changes at this time.  Call if home bp's consistently >140/90 BMET today.  2) HLD: tolerating statin. Lipids and hepatic panel fine 6 mo ago. Plan repeat FLP/hepatic panel 6 mo.  3) Hx of increased PSA velocity: stable/normal the last couple of years. Plan repeat 6 mo and if stable then no further checks recommended.  4) Chronic hearing loss, unilateral tinnitus on L. ENT eval-->normal carotid dopplers. Pt wants to take obs approach, possibly get hearing aids in future.  An After Visit Summary was printed and given to the patient.  FOLLOW UP: Return for routine chronic illness f/u.  Signed:  Crissie Sickles, MD           11/01/2019

## 2019-11-02 ENCOUNTER — Other Ambulatory Visit (INDEPENDENT_AMBULATORY_CARE_PROVIDER_SITE_OTHER): Payer: Medicare Other

## 2019-11-02 ENCOUNTER — Encounter: Payer: Self-pay | Admitting: Family Medicine

## 2019-11-02 DIAGNOSIS — R7301 Impaired fasting glucose: Secondary | ICD-10-CM | POA: Diagnosis not present

## 2019-11-02 LAB — HEMOGLOBIN A1C: Hgb A1c MFr Bld: 5.5 % (ref 4.6–6.5)

## 2019-11-04 ENCOUNTER — Encounter: Payer: Self-pay | Admitting: Family Medicine

## 2019-11-05 ENCOUNTER — Other Ambulatory Visit: Payer: Self-pay | Admitting: Family Medicine

## 2019-11-29 DIAGNOSIS — L570 Actinic keratosis: Secondary | ICD-10-CM | POA: Diagnosis not present

## 2019-12-23 ENCOUNTER — Ambulatory Visit: Payer: Medicare Other

## 2019-12-25 ENCOUNTER — Other Ambulatory Visit: Payer: Self-pay | Admitting: Family Medicine

## 2020-01-15 ENCOUNTER — Other Ambulatory Visit: Payer: Self-pay | Admitting: Family Medicine

## 2020-01-26 DIAGNOSIS — L570 Actinic keratosis: Secondary | ICD-10-CM | POA: Diagnosis not present

## 2020-03-22 ENCOUNTER — Encounter: Payer: Self-pay | Admitting: Family Medicine

## 2020-04-07 ENCOUNTER — Other Ambulatory Visit: Payer: Self-pay | Admitting: Family Medicine

## 2020-05-01 ENCOUNTER — Other Ambulatory Visit: Payer: Self-pay

## 2020-05-01 ENCOUNTER — Encounter: Payer: Self-pay | Admitting: Family Medicine

## 2020-05-01 ENCOUNTER — Ambulatory Visit (INDEPENDENT_AMBULATORY_CARE_PROVIDER_SITE_OTHER): Payer: Medicare Other | Admitting: Family Medicine

## 2020-05-01 VITALS — BP 154/82 | HR 63 | Temp 97.7°F | Resp 16 | Ht 69.0 in | Wt 168.8 lb

## 2020-05-01 DIAGNOSIS — E7800 Pure hypercholesterolemia, unspecified: Secondary | ICD-10-CM | POA: Diagnosis not present

## 2020-05-01 DIAGNOSIS — R7301 Impaired fasting glucose: Secondary | ICD-10-CM | POA: Diagnosis not present

## 2020-05-01 DIAGNOSIS — R972 Elevated prostate specific antigen [PSA]: Secondary | ICD-10-CM | POA: Diagnosis not present

## 2020-05-01 DIAGNOSIS — I10 Essential (primary) hypertension: Secondary | ICD-10-CM

## 2020-05-01 LAB — COMPREHENSIVE METABOLIC PANEL
ALT: 12 U/L (ref 0–53)
AST: 15 U/L (ref 0–37)
Albumin: 4.2 g/dL (ref 3.5–5.2)
Alkaline Phosphatase: 64 U/L (ref 39–117)
BUN: 15 mg/dL (ref 6–23)
CO2: 31 mEq/L (ref 19–32)
Calcium: 9.3 mg/dL (ref 8.4–10.5)
Chloride: 105 mEq/L (ref 96–112)
Creatinine, Ser: 0.98 mg/dL (ref 0.40–1.50)
GFR: 73.45 mL/min (ref >60.00)
Glucose, Bld: 98 mg/dL (ref 70–99)
Potassium: 5.2 mEq/L — ABNORMAL HIGH (ref 3.5–5.1)
Sodium: 140 mEq/L (ref 135–145)
Total Bilirubin: 0.5 mg/dL (ref 0.2–1.2)
Total Protein: 6.3 g/dL (ref 6.0–8.3)

## 2020-05-01 LAB — LIPID PANEL
Cholesterol: 181 mg/dL (ref 0–200)
HDL: 63.5 mg/dL (ref >39.00)
LDL Cholesterol: 103 mg/dL — ABNORMAL HIGH (ref 0–99)
NonHDL: 117.34
Total CHOL/HDL Ratio: 3
Triglycerides: 73 mg/dL (ref 0.0–149.0)
VLDL: 14.6 mg/dL (ref 0.0–40.0)

## 2020-05-01 LAB — PSA, MEDICARE: PSA: 1.64 ng/ml (ref 0.10–4.00)

## 2020-05-01 NOTE — Progress Notes (Signed)
OFFICE VISIT  05/01/2020  CC:  Chief Complaint  Patient presents with  . Follow-up    RCI, pt is fasting   HPI:    Patient is a 79 y.o. Caucasian male who presents for 6 mo f/u HTN, HLD, IFG, and increased PSA velocity. Last 4 annual PSA measurments have been stable in normal range.  A/P as of last visit: "1) HTN: bp up here but pt says consistently normal at home with regular monitoring. No changes at this time.  Call if home bp's consistently >140/90 BMET today.  2) HLD: tolerating statin. Lipids and hepatic panel fine 6 mo ago. Plan repeat FLP/hepatic panel 6 mo.  3) Hx of increased PSA velocity: stable/normal the last couple of years. Plan repeat 6 mo and if stable then no further checks recommended.  4) Chronic hearing loss, unilateral tinnitus on L. ENT eval-->normal carotid dopplers. Pt wants to take obs approach, possibly get hearing aids in future."  INTERIM HX: HOme bp measurements : 146/81 this morning.  Five min later 130/73. Going back to the gym for exercise now. Diet is ok. Working in yard.  ROS: no fevers, no CP, no SOB, no wheezing, no cough, no dizziness, no HAs, no rashes, no melena/hematochezia.  No polyuria or polydipsia.  No myalgias or arthralgias.  No focal weakness, paresthesias, or tremors.  No acute vision or hearing abnormalities. No n/v/d or abd pain.  No palpitations.    Past Medical History:  Diagnosis Date  . Colon polyps    Repeat TCS on 09/04/2014 showed no polyps and GI MD recommended NO FURTHER COLONOSCOPIES  . Hyperkalemia    ? secondary to ACE-I?  Dose decreased by 50% 02/22/16 and repeat K was in normal range.   . Hyperlipidemia   . Hypertension   . IFG (impaired fasting glucose) 11/01/2019   gluc 110. HbAc 5.5%.  . Increased prostate specific antigen (PSA) velocity    2018: on recheck 6 mo later PSA returned to his baseline (<2.0).  Repeat 02/2018 stable.    . Nonmelanoma skin cancer 2009   sees skin MD annually  .  Sensorineural hearing loss (SNHL) of both ears 2018   [facility]--hearing aids recommended 02/2017.  . Tinnitus aurium, left    + chronic bilat hearing loss    Past Surgical History:  Procedure Laterality Date  . COLONOSCOPY  10/12/12015   mild diverticulosis.  No polyps.  No further screening colonoscopies recommended ([facility])  . COLONOSCOPY W/ POLYPECTOMY  06/2009   adenomatous polyps; recall 06/2014 ([facility] [place]  . INGUINAL HERNIA REPAIR Left 2001  . TONSILLECTOMY    . US CAROTID DOPPLER BILATERAL ([facility])  10/26/2019   No signif stenosis on either side.  vert's patent with antegrade flow    Outpatient Medications Prior to Visit  Medication Sig Dispense Refill  . ELDERBERRY PO Take by mouth. Take 1 gummy daily.    . hydrochlorothiazide (MICROZIDE) 12.5 MG capsule TAKE 1 CAPSULE DAILY 90 capsule 1  . rosuvastatin (CRESTOR) 5 MG tablet TAKE 1 TABLET DAILY 90 tablet 0  . verapamil (CALAN-SR) 120 MG CR tablet TAKE 2 TABLETS DAILY 180 tablet 1  . VITAMIN D PO Take by mouth daily.    . ASPIRIN 81 PO Take 81 mg by mouth daily.     No facility-administered medications prior to visit.    No Known Allergies  ROS As per HPI  PE: Vitals with BMI 05/01/2020 11/01/2019 11/01/2019  Height 5'  9" - 5\' 9"   Weight 168 lbs 13 oz - 173 lbs 3 oz  BMI 85.02 - 77.41  Systolic 287 867 672  Diastolic 82 84 80  Pulse 63 - 67    Gen: Alert, well appearing.  Patient is oriented to person, place, time, and situation. AFFECT: pleasant, lucid thought and speech. CNO:BSJG: no injection, icteris, swelling, or exudate.  EOMI, PERRLA. Mouth: lips without lesion/swelling.  Oral mucosa pink and moist. Oropharynx without erythema, exudate, or swelling.  Neck - No masses or thyromegaly or limitation in range of motion CV: RRR, no m/r/g.   LUNGS: CTA bilat, nonlabored resps, good aeration in all lung fields. ABD: soft/NT/ND EXT: no clubbing or cyanosis.   no edema.  Skin - no sores or suspicious lesions or rashes or color changes   LABS:  Lab Results  Component Value Date   TSH 3.14 02/19/2016   Lab Results  Component Value Date   WBC 6.4 02/09/2018   HGB 15.0 02/09/2018   HCT 44.5 02/09/2018   MCV 95.6 02/09/2018   PLT 244.0 02/09/2018   Lab Results  Component Value Date   CREATININE 0.92 11/01/2019   BUN 16 11/01/2019   NA 140 11/01/2019   K 4.9 11/01/2019   CL 102 11/01/2019   CO2 32 11/01/2019   Lab Results  Component Value Date   ALT 13 05/03/2019   AST 15 05/03/2019   ALKPHOS 61 05/03/2019   BILITOT 0.4 05/03/2019   Lab Results  Component Value Date   CHOL 195 05/03/2019   Lab Results  Component Value Date   HDL 53.60 05/03/2019   Lab Results  Component Value Date   LDLCALC 122 (H) 05/03/2019   Lab Results  Component Value Date   TRIG 97.0 05/03/2019   Lab Results  Component Value Date   CHOLHDL 4 05/03/2019   Lab Results  Component Value Date   PSA 2.08 05/03/2019   PSA 2.23 03/09/2018   PSA 2.16 02/24/2017   Lab Results  Component Value Date   HGBA1C 5.5 11/02/2019     IMPRESSION AND PLAN:  1) HTN: The current medical regimen is effective;  continue present plan and medications. Lytes/cr today. Monitor bp and HR twice per week and if avg not consistently close to 130/80 then call/return.  2) HLD: tolerating low dose statin. FLP and hepatic panel today.  3) Hx of inc PSA velocity: last 4 measurements stable. Recheck PSA today and if stable then no further checks recommended.  4) Preventative health care: discussed new research showing lack of evidence to support use of ASA as primary CV prevention (+potential inc risk of bleeding).  D/C aspirin recommended today and he expressed understanding and agreement with plan.  5) IFG: very mild.  A1c 6 mo ago 5.5%. Plan fasting gluc today, next repeat A1c 6 mo.  An After Visit Summary was printed and given to the patient.  FOLLOW UP:  Return in about 6 months (around 10/31/2020) for routine chronic illness f/u.  Signed:  Crissie Sickles, MD           05/01/2020

## 2020-05-29 ENCOUNTER — Other Ambulatory Visit: Payer: Self-pay | Admitting: Family Medicine

## 2020-06-09 ENCOUNTER — Other Ambulatory Visit: Payer: Self-pay | Admitting: Family Medicine

## 2020-08-20 DIAGNOSIS — L905 Scar conditions and fibrosis of skin: Secondary | ICD-10-CM | POA: Diagnosis not present

## 2020-08-20 DIAGNOSIS — D1801 Hemangioma of skin and subcutaneous tissue: Secondary | ICD-10-CM | POA: Diagnosis not present

## 2020-08-20 DIAGNOSIS — L814 Other melanin hyperpigmentation: Secondary | ICD-10-CM | POA: Diagnosis not present

## 2020-08-20 DIAGNOSIS — L821 Other seborrheic keratosis: Secondary | ICD-10-CM | POA: Diagnosis not present

## 2020-08-20 DIAGNOSIS — Z85828 Personal history of other malignant neoplasm of skin: Secondary | ICD-10-CM | POA: Diagnosis not present

## 2020-08-20 DIAGNOSIS — L570 Actinic keratosis: Secondary | ICD-10-CM | POA: Diagnosis not present

## 2020-10-21 ENCOUNTER — Other Ambulatory Visit: Payer: Self-pay | Admitting: Family Medicine

## 2020-11-01 ENCOUNTER — Ambulatory Visit: Payer: Medicare Other | Admitting: Family Medicine

## 2020-11-28 ENCOUNTER — Other Ambulatory Visit: Payer: Self-pay

## 2020-11-29 ENCOUNTER — Encounter: Payer: Self-pay | Admitting: Family Medicine

## 2020-11-29 ENCOUNTER — Ambulatory Visit (INDEPENDENT_AMBULATORY_CARE_PROVIDER_SITE_OTHER): Payer: Medicare HMO | Admitting: Family Medicine

## 2020-11-29 VITALS — BP 143/82 | HR 65 | Temp 97.6°F | Resp 16 | Ht 69.0 in | Wt 177.2 lb

## 2020-11-29 DIAGNOSIS — I10 Essential (primary) hypertension: Secondary | ICD-10-CM | POA: Diagnosis not present

## 2020-11-29 DIAGNOSIS — R7301 Impaired fasting glucose: Secondary | ICD-10-CM | POA: Diagnosis not present

## 2020-11-29 DIAGNOSIS — E7800 Pure hypercholesterolemia, unspecified: Secondary | ICD-10-CM

## 2020-11-29 DIAGNOSIS — Z23 Encounter for immunization: Secondary | ICD-10-CM

## 2020-11-29 LAB — COMPREHENSIVE METABOLIC PANEL
ALT: 14 U/L (ref 0–53)
AST: 16 U/L (ref 0–37)
Albumin: 4.2 g/dL (ref 3.5–5.2)
Alkaline Phosphatase: 59 U/L (ref 39–117)
BUN: 20 mg/dL (ref 6–23)
CO2: 33 mEq/L — ABNORMAL HIGH (ref 19–32)
Calcium: 9.5 mg/dL (ref 8.4–10.5)
Chloride: 104 mEq/L (ref 96–112)
Creatinine, Ser: 1.03 mg/dL (ref 0.40–1.50)
GFR: 68.21 mL/min (ref >60.00)
Glucose, Bld: 94 mg/dL (ref 70–99)
Potassium: 4.6 mEq/L (ref 3.5–5.1)
Sodium: 141 mEq/L (ref 135–145)
Total Bilirubin: 0.6 mg/dL (ref 0.2–1.2)
Total Protein: 6.6 g/dL (ref 6.0–8.3)

## 2020-11-29 LAB — LIPID PANEL
Cholesterol: 196 mg/dL (ref 0–200)
HDL: 57.6 mg/dL (ref >39.00)
LDL Cholesterol: 108 mg/dL — ABNORMAL HIGH (ref 0–99)
NonHDL: 137.98
Total CHOL/HDL Ratio: 3
Triglycerides: 149 mg/dL (ref 0.0–149.0)
VLDL: 29.8 mg/dL (ref 0.0–40.0)

## 2020-11-29 LAB — HEMOGLOBIN A1C: Hgb A1c MFr Bld: 5.7 % (ref 4.6–6.5)

## 2020-11-29 MED ORDER — ZOSTER VAC RECOMB ADJUVANTED 50 MCG/0.5ML IM SUSR: 0.5000 mL | Freq: Once | INTRAMUSCULAR | 1 refills | Status: AC

## 2020-11-29 NOTE — Progress Notes (Signed)
See student note from this date. Signed:  Phil Hadasah Brugger, MD           11/29/2020  

## 2020-11-29 NOTE — Progress Notes (Signed)
CC: 6 month f/u HTN, HLD, IFG  HPI:  Curtis Matthews is a 80 yo male who presents to the clinic today for 6 month f/u for HTN, HLD, IFG.  Patient records BP at home and reports that when he initially checks after being active they will sometimes be in the Q000111Q systolic. After sitting down for a few minutes his pressures will read in the Q000111Q systolic and Q000111Q diastolic. Patient reports his HR is usually in the 50 - 60 bpm range. Patient reports great daily compliance with HCTZ and verapamil. Patient denies any light-headedness, dizziness, or headaches. Patient reports that his diet could improve some, but he eats a balanced diet. For exercise he goes to the ym once a week and walks 45 minutes two to three times a week. He takes rosuva 5mg  daily and has no side effects.  ROS: no fevers, no CP, no SOB, no wheezing, no cough, no dizziness, no HAs, no rashes, no melena/hematochezia.  No polyuria or polydipsia.  No myalgias or arthralgias.  No focal weakness, paresthesias, or tremors.  No acute vision or hearing abnormalities. No n/v/d or abd pain.  No palpitations.     PMH: Past Medical History:  Diagnosis Date  . Colon polyps    Repeat TCS on 09/04/2014 showed no polyps and GI MD recommended NO FURTHER COLONOSCOPIES  . Hyperkalemia    ? secondary to ACE-I?  Dose decreased by 50% 02/22/16 and repeat K was in normal range.   . Hyperlipidemia   . Hypertension   . IFG (impaired fasting glucose) 11/01/2019   gluc 110. HbAc 5.5%.  . Increased prostate specific antigen (PSA) velocity    2018: on recheck 6 mo later PSA returned to his baseline (<2.0).  Repeat 02/2018 stable.    . Nonmelanoma skin cancer 2009   sees skin MD annually  . Sensorineural hearing loss (SNHL) of both ears 2018   [facility]--hearing aids recommended 02/2017.  . Tinnitus aurium, left    + chronic bilat hearing loss    M/A: Current Outpatient Medications on File Prior to Visit  Medication Sig Dispense Refill  .  ELDERBERRY PO Take by mouth. Take 1 gummy daily.    . hydrochlorothiazide (MICROZIDE) 12.5 MG capsule TAKE 1 CAPSULE DAILY 90 capsule 1  . rosuvastatin (CRESTOR) 5 MG tablet TAKE 1 TABLET DAILY 90 tablet 0  . verapamil (CALAN-SR) 120 MG CR tablet TAKE 2 TABLETS DAILY 180 tablet 1  . VITAMIN D PO Take by mouth daily.     No current facility-administered medications on file prior to visit.   No Known Allergies  FH: Family History  Problem Relation Age of Onset  . Heart disease Mother   . Hypertension Mother     SH: Social History   Socioeconomic History  . Marital status: Married    Spouse name: Not on file  . Number of children: Not on file  . Years of education: Not on file  . Highest education level: Not on file  Occupational History  . Not on file  Tobacco Use  . Smoking status: Never Smoker  . Smokeless tobacco: Never Used  Substance and Sexual Activity  . Alcohol use: Yes    Alcohol/week: 1.0 standard drink    Types: 1 Glasses of wine per week    Comment: glass of wine daily  . Drug use: No  . Sexual activity: Not on file  Other Topics Concern  . Not on file  Social History Narrative  Married, has one adopted son.   Occupation:  Retired Chief Executive Officer for [employer].   Never smoker, alcohol--1-2 glasses of wine per day.     No hx of alc abuse or drug use.   Social Determinants of Health   Financial Resource Strain: Not on file  Food Insecurity: Not on file  Transportation Needs: Not on file  Physical Activity: Not on file  Stress: Not on file  Social Connections: Not on file    ROS: Review of Systems  HENT: Positive for hearing loss.   Cardiovascular: Negative for chest pain, palpitations and leg swelling.  Neurological: Negative for dizziness, weakness and headaches.    PE: Vitals with BMI 05/01/2020 11/01/2019 11/01/2019  Height 5\' 9"  - 5\' 9"   Weight 168 lbs 13 oz - 173 lbs 3 oz  BMI [id number] - 0000000  Systolic 123456 [id number] 123456  Diastolic 82 84 80   Pulse 63 - 67    Physical Exam Constitutional:      Appearance: Normal appearance.  HENT:     Head: Normocephalic and atraumatic.  Cardiovascular:     Rate and Rhythm: Normal rate and regular rhythm.  Pulmonary:     Effort: Pulmonary effort is normal.     Breath sounds: Normal breath sounds.  Neurological:     Mental Status: He is alert.   EXT: no clubbing or cyanosis.  no edema.    Labs:   Chemistry      Component Value Date/Time   NA 140 05/01/2020 1034   K 5.2 (H) 05/01/2020 1034   CL 105 05/01/2020 1034   CO2 31 05/01/2020 1034   BUN 15 05/01/2020 1034   CREATININE 0.98 05/01/2020 1034      Component Value Date/Time   CALCIUM 9.3 05/01/2020 1034   ALKPHOS 64 05/01/2020 1034   AST 15 05/01/2020 1034   ALT 12 05/01/2020 1034   BILITOT 0.5 05/01/2020 1034     Lab Results  Component Value Date   CHOL 181 05/01/2020   HDL 63.50 05/01/2020   LDLCALC 103 (H) 05/01/2020   TRIG 73.0 05/01/2020   CHOLHDL 3 05/01/2020   Lab Results  Component Value Date   HGBA1C 5.5 11/02/2019   Lab Results  Component Value Date   PSA 1.64 05/01/2020   PSA 2.08 05/03/2019   PSA 2.23 03/09/2018     A/P: In summary, Curtis Matthews is a 80 y.o. year old male who presents to the clinic today for 6 month f/u for HTN, HLD, and IFG. Physical exam unremarkable.    1) HTN:The current medical regimen is effective. Some home hypertensive measurements in the Q000111Q systolic after activities. Otherwise home BP measurements usually Q000111Q systolic and Q000111Q dastolic. HR measurements at home in the 50 - 60 bpm range. Through shared decision making, the patient would like 6 months to improve diet and exercise to improve BP. We are in agreement that this is completely reasonable. We will continue current HCTZ and verapamil management plan and reassess in 6 months. - Continue hydrochlorothiazide 12.5 mg qd. - Continue verapamil 120 mg 2 tablets qd. - Monitor home BP and HR.  2) HLD: tolerating  low dose statin. - FLP and hepatic panel today.  3) Hx of inc PSA velocity: last 5 measurement have been stable. - PSA testing no longer indicated.  4) IFG: very mild. A1c 1 year ago ago was 5.5%. - A1c today. Continue to work on [employer]  5) Health Maintenance: Patient would like Shingles  vaccine. Will place order today for patient to get vaccine at pharmacy. Flu -> UTD. Covid-19: UTD, including 2 initial doses and booster.  6) Hearing loss: patient having chronic progressive hearing loss. Hearing loss becoming significant enough to impact conversations. Patient states he is having more difficulty hearing male voices compared to males. Patient does not believe this is pathological and attributes hearing loss to ageing. Was seen by audiology in the past who recommended hearing aid device. Patient is looking into options at [facility] for hearing aids.    Lab Orders  No laboratory test(s) ordered today     Follow Up:  6 months routine chronic illness Signed: Sol Blazing, MS3  I personally was present during the history, physical exam, and medical decision-making activities of this service and have verified that the service and findings are accurately documented in the student's note. Signed:  Santiago Bumpers, MD           11/29/2020

## 2020-11-29 NOTE — Addendum Note (Signed)
Addended by: Emi Holes D on: 11/29/2020 01:23 PM   Modules accepted: Orders

## 2020-12-17 ENCOUNTER — Encounter: Payer: Self-pay | Admitting: Family Medicine

## 2020-12-18 NOTE — Telephone Encounter (Signed)
Yes pls do 90 d supplies of the meds he is requesting, with RF x 3. Ok to change verapamil (calan SR) to 240 mg tabs, 1 qd, #90, RF x 3. -thx

## 2020-12-19 MED ORDER — VERAPAMIL HCL ER 240 MG PO TBCR
240.0000 mg | EXTENDED_RELEASE_TABLET | Freq: Every day | ORAL | 1 refills | Status: DC
Start: 2020-12-19 — End: 2021-06-10

## 2020-12-19 MED ORDER — HYDROCHLOROTHIAZIDE 12.5 MG PO CAPS
12.5000 mg | ORAL_CAPSULE | Freq: Every day | ORAL | 1 refills | Status: DC
Start: 2020-12-19 — End: 2021-08-30

## 2020-12-19 MED ORDER — ROSUVASTATIN CALCIUM 5 MG PO TABS
5.0000 mg | ORAL_TABLET | Freq: Every day | ORAL | 1 refills | Status: DC
Start: 2020-12-19 — End: 2021-12-30

## 2021-03-12 ENCOUNTER — Other Ambulatory Visit: Payer: Self-pay | Admitting: Family Medicine

## 2021-03-14 ENCOUNTER — Other Ambulatory Visit: Payer: Self-pay | Admitting: Family Medicine

## 2021-03-21 DIAGNOSIS — Z0100 Encounter for examination of eyes and vision without abnormal findings: Secondary | ICD-10-CM | POA: Diagnosis not present

## 2021-05-14 ENCOUNTER — Telehealth: Payer: Self-pay | Admitting: Family Medicine

## 2021-05-14 NOTE — Telephone Encounter (Signed)
Left message for patient to schedule Annual Wellness Visit.  Please schedule with Nurse Health Advisor Leroy Kennedy, RN at [facility].

## 2021-06-04 ENCOUNTER — Encounter: Payer: Self-pay | Admitting: Family Medicine

## 2021-06-04 ENCOUNTER — Other Ambulatory Visit: Payer: Self-pay

## 2021-06-04 ENCOUNTER — Ambulatory Visit (INDEPENDENT_AMBULATORY_CARE_PROVIDER_SITE_OTHER): Payer: Medicare HMO | Admitting: Family Medicine

## 2021-06-04 VITALS — BP 159/77 | HR 70 | Temp 97.6°F | Resp 16 | Ht 69.0 in | Wt 171.2 lb

## 2021-06-04 DIAGNOSIS — R7301 Impaired fasting glucose: Secondary | ICD-10-CM | POA: Diagnosis not present

## 2021-06-04 DIAGNOSIS — E7800 Pure hypercholesterolemia, unspecified: Secondary | ICD-10-CM

## 2021-06-04 DIAGNOSIS — Z0000 Encounter for general adult medical examination without abnormal findings: Secondary | ICD-10-CM

## 2021-06-04 DIAGNOSIS — I10 Essential (primary) hypertension: Secondary | ICD-10-CM | POA: Diagnosis not present

## 2021-06-04 LAB — CBC WITH DIFFERENTIAL/PLATELET
Basophils Absolute: 0 10*3/uL (ref 0.0–0.1)
Basophils Relative: 0.7 % (ref 0.0–3.0)
Eosinophils Absolute: 0.3 10*3/uL (ref 0.0–0.7)
Eosinophils Relative: 4.6 % (ref 0.0–5.0)
HCT: 44.8 % (ref 39.0–52.0)
Hemoglobin: 15.2 g/dL (ref 13.0–17.0)
Lymphocytes Relative: 23.1 % (ref 12.0–46.0)
Lymphs Abs: 1.4 10*3/uL (ref 0.7–4.0)
MCHC: 34 g/dL (ref 30.0–36.0)
MCV: 95.6 fl (ref 78.0–100.0)
Monocytes Absolute: 0.5 10*3/uL (ref 0.1–1.0)
Monocytes Relative: 8.7 % (ref 3.0–12.0)
Neutro Abs: 3.8 10*3/uL (ref 1.4–7.7)
Neutrophils Relative %: 62.9 % (ref 43.0–77.0)
Platelets: 227 10*3/uL (ref 150.0–400.0)
RBC: 4.69 Mil/uL (ref 4.22–5.81)
RDW: 13.5 % (ref 11.5–15.5)
WBC: 6.1 10*3/uL (ref 4.0–10.5)

## 2021-06-04 NOTE — Progress Notes (Signed)
Office Note 06/04/2021  CC:  Chief Complaint  Patient presents with   Follow-up    RCI, 6 mo. Pt is fasting    HPI:  Laurie Lovejoy is a 80 y.o. White male who is here for annual health maintenance exam and 6 mo f/u HTN, HLD, and IFG. A/P as of last visit: "1) HTN: The current medical regimen is effective. Some home hypertensive measurements in the 619J systolic after activities. Otherwise home BP measurements usually 093O systolic and 67T dastolic. HR measurements at home in the 50 - 60 bpm range. Through shared decision making, the patient would like 6 months to improve diet and exercise to improve BP. We are in agreement that this is completely reasonable. We will continue current HCTZ and verapamil management plan and reassess in 6 months. - Continue hydrochlorothiazide 12.5 mg qd. - Continue verapamil 120 mg 2 tablets qd. - Monitor home BP and HR.   2) HLD: tolerating low dose statin. - FLP and hepatic panel today.   3) Hx of inc PSA velocity: last 5 measurement have been stable. - PSA testing no longer indicated.   4) IFG: very mild.  A1c 1 year ago ago was 5.5%. - A1c today. Continue to work on [employer]   5) Health Maintenance: Patient would like Shingles vaccine. Will place order today for patient to get vaccine at pharmacy. Flu -> UTD. Covid-19: UTD, including 2 initial doses and booster.   6) Hearing loss: patient having chronic progressive hearing loss. Hearing loss becoming significant enough to impact conversations. Patient states he is having more difficulty hearing male voices compared to males. Patient does not believe this is pathological and attributes hearing loss to ageing. Was seen by audiology in the past who recommended hearing aid device. Patient is looking into options at [facility] for hearing aids."  INTERIM HX: Feeling fine, no acute complaints. Diet is healthy, he works out a few days a week, golf occasionally, admits he can do better regarding efforts at  diet/exercise/wt loss.  Home bp's avg low 130s/70s, nothing higher than 136 syst. No probs with hctz 12.5mg  qd and verapamil SR 240 qd.  HLD: tolerating rosuva 5 qd.  Past Medical History:  Diagnosis Date   Colon polyps    Repeat TCS on 09/04/2014 showed no polyps and GI MD recommended NO FURTHER COLONOSCOPIES   Hyperkalemia    ? secondary to ACE-I?  Dose decreased by 50% 02/22/16 and repeat K was in normal range.    Hyperlipidemia    Hypertension    IFG (impaired fasting glucose) 11/01/2019   gluc 110. HbAc 5.5%.   Increased prostate specific antigen (PSA) velocity    2018: on recheck 6 mo later PSA returned to his baseline (<2.0).  Repeat 02/2018 stable.     Nonmelanoma skin cancer 2009   sees skin MD annually   Sensorineural hearing loss (SNHL) of both ears 2018   [facility] [place]--hearing aids recommended 02/2017.   Tinnitus aurium, left    + chronic bilat hearing loss    Past Surgical History:  Procedure Laterality Date   COLONOSCOPY  10/12/12015   mild diverticulosis.  No polyps.  No further screening colonoscopies recommended ([facility])   COLONOSCOPY W/ POLYPECTOMY  06/2009   adenomatous polyps; [place] 06[facility] [place] [place]   [place] Left 2001   TONSILLECTOMY     US CAROTID DOPPLER BILATERAL ([place] [facility])  10/26/2019   No signif stenosis on either side.  vert's patent with  antegrade flow    Family History  Problem Relation Age of Onset   Heart disease Mother    Hypertension Mother     Social History   Socioeconomic History   Marital status: Married    Spouse name: Not on file   Number of children: Not on file   Years of education: Not on file   Highest education level: Not on file  Occupational History   Not on file  Tobacco Use   Smoking status: Never   Smokeless tobacco: Never  Substance and Sexual Activity   Alcohol use: Yes    Alcohol/week: 1.0 standard drink    Types: 1 Glasses of wine per  week    Comment: glass of wine daily   Drug use: No   Sexual activity: Not on file  Other Topics Concern   Not on file  Social History Narrative   Married, has one adopted son.   Occupation:  Retired Chief Executive Officer for [employer].   Never smoker, alcohol--1-2 glasses of wine per day.     No hx of alc abuse or drug use.   Social Determinants of Health   Financial Resource Strain: Not on file  Food Insecurity: Not on file  Transportation Needs: Not on file  Physical Activity: Not on file  Stress: Not on file  Social Connections: Not on file  Intimate Partner Violence: Not on file    Outpatient Medications Prior to Visit  Medication Sig Dispense Refill   ELDERBERRY PO Take by mouth. Take 1 gummy daily.     hydrochlorothiazide (MICROZIDE) 12.5 MG capsule Take 1 capsule (12.5 mg total) by mouth daily. 90 capsule 1   rosuvastatin (CRESTOR) 5 MG tablet Take 1 tablet (5 mg total) by mouth daily. 90 tablet 1   verapamil (CALAN-SR) 240 MG CR tablet Take 1 tablet (240 mg total) by mouth daily. 90 tablet 1   VITAMIN D PO Take by mouth daily.     No facility-administered medications prior to visit.   No Known Allergies  ROS Review of Systems  Constitutional:  Negative for appetite change, chills, fatigue and fever.  HENT:  Negative for congestion, dental problem, ear pain and sore throat.   Eyes:  Negative for discharge, redness and visual disturbance.  Respiratory:  Negative for cough, chest tightness, shortness of breath and wheezing.   Cardiovascular:  Negative for chest pain, palpitations and leg swelling.  Gastrointestinal:  Negative for abdominal pain, blood in stool, diarrhea, nausea and vomiting.  Genitourinary:  Negative for difficulty urinating, dysuria, flank pain, frequency, hematuria and urgency.  Musculoskeletal:  Negative for arthralgias, back pain, joint swelling, myalgias and neck stiffness.  Skin:  Negative for pallor and rash.  Neurological:  Negative for  dizziness, speech difficulty, weakness and headaches.  Hematological:  Negative for adenopathy. Does not bruise/bleed easily.  Psychiatric/Behavioral:  Negative for confusion and sleep disturbance. The patient is not nervous/anxious.    PE; Vitals with BMI 06/04/2021 11/29/2020 05/01/2020  Height 5\' 9"  5\' 9"  5\' 9"   Weight 171 lbs 3 oz 177 lbs 3 oz 168 lbs 13 oz  BMI 25.27 17.51 02.58  Systolic 527 782 423  Diastolic 77 82 82  Pulse 70 65 63  Manual bp after 10 minute wait in room was 150/80.  Gen: Alert, well appearing.  Patient is oriented to person, place, time, and situation. AFFECT: pleasant, lucid thought and speech. ENT: Ears: EACs clear, normal epithelium.  TMs with good light reflex and landmarks bilaterally.  Eyes: no injection, icteris, swelling, or exudate.  EOMI, PERRLA. Nose: no drainage or turbinate edema/swelling.  No injection or focal lesion.  Mouth: lips without lesion/swelling.  Oral mucosa pink and moist.  Dentition intact and without obvious caries or gingival swelling.  Oropharynx without erythema, exudate, or swelling.  Neck: supple/nontender.  No LAD, mass, or TM.  Carotid pulses 2+ bilaterally, without bruits. CV: RRR, no m/r/g.   LUNGS: CTA bilat, nonlabored resps, good aeration in all lung fields. ABD: soft, NT, ND, BS normal.  No hepatospenomegaly or mass.  No bruits. EXT: no clubbing, cyanosis, or edema.  Musculoskeletal: no joint swelling, erythema, warmth, or tenderness.  ROM of all joints intact. Skin - no sores or suspicious lesions or rashes or color changes  Pertinent labs:  Lab Results  Component Value Date   TSH 3.14 02/19/2016   Lab Results  Component Value Date   WBC 6.4 02/09/2018   HGB 15.0 02/09/2018   HCT 44.5 02/09/2018   MCV 95.6 02/09/2018   PLT 244.0 02/09/2018   Lab Results  Component Value Date   CREATININE 1.03 11/29/2020   BUN 20 11/29/2020   NA 141 11/29/2020   K 4.6 11/29/2020   CL 104 11/29/2020   CO2 33 (H) 11/29/2020    Lab Results  Component Value Date   ALT 14 11/29/2020   AST 16 11/29/2020   ALKPHOS 59 11/29/2020   BILITOT 0.6 11/29/2020   Lab Results  Component Value Date   CHOL 196 11/29/2020   Lab Results  Component Value Date   HDL 57.60 11/29/2020   Lab Results  Component Value Date   LDLCALC 108 (H) 11/29/2020   Lab Results  Component Value Date   TRIG 149.0 11/29/2020   Lab Results  Component Value Date   CHOLHDL 3 11/29/2020   Lab Results  Component Value Date   PSA 1.64 05/01/2020   PSA 2.08 05/03/2019   PSA 2.23 03/09/2018   Lab Results  Component Value Date   HGBA1C 5.7 11/29/2020   ASSESSMENT AND PLAN:   1) HTN:  mild elev systolics in office consistently but home avg very near normal. Shared decision-making process utilized today and we agree no change in bp med at this time. Cont home bp monitoring, call if signif chances. Lytes/cr today.  2) HLD: tolerating rosuva 5 qd. FLP and hepatic panel today.  3) IFG, longstanding, mild.  Hba1cs have only been ULN. Plan repeat a1c 6 mo. Fasting glucose today.  4) Health maintenance exam: Reviewed age and gender appropriate health maintenance issues (prudent diet, regular exercise, health risks of tobacco and excessive alcohol, use of seatbelts, fire alarms in home, use of sunscreen).  Also reviewed age and gender appropriate health screening as well as vaccine recommendations. Vaccines: ALL UTD. Labs: cbc, cmet, flp, a1c. Prostate ca screening: no further prostate ca screening indicated. Colon ca screening: no further colon ca screening indicated.  An After Visit Summary was printed and given to the patient.  FOLLOW UP:  No follow-ups on file.  Signed:  Crissie Sickles, MD           06/04/2021

## 2021-06-05 LAB — COMPREHENSIVE METABOLIC PANEL
AG Ratio: 1.6 (calc) (ref 1.0–2.5)
ALT: 12 U/L (ref 9–46)
AST: 14 U/L (ref 10–35)
Albumin: 4.1 g/dL (ref 3.6–5.1)
Alkaline phosphatase (APISO): 62 U/L (ref 35–144)
BUN: 19 mg/dL (ref 7–25)
CO2: 28 mmol/L (ref 20–32)
Calcium: 9.1 mg/dL (ref 8.6–10.3)
Chloride: 105 mmol/L (ref 98–110)
Creat: 1.06 mg/dL (ref 0.70–1.22)
Globulin: 2.5 g/dL (calc) (ref 1.9–3.7)
Glucose, Bld: 98 mg/dL (ref 65–99)
Potassium: 4.3 mmol/L (ref 3.5–5.3)
Sodium: 141 mmol/L (ref 135–146)
Total Bilirubin: 0.4 mg/dL (ref 0.2–1.2)
Total Protein: 6.6 g/dL (ref 6.1–8.1)

## 2021-06-05 LAB — LIPID PANEL
Cholesterol: 191 mg/dL (ref <200)
HDL: 68 mg/dL (ref >=40)
LDL Cholesterol (Calc): 106 mg/dL (calc) — ABNORMAL HIGH
Non-HDL Cholesterol (Calc): 123 mg/dL (calc) (ref <130)
Total CHOL/HDL Ratio: 2.8 (calc) (ref <5.0)
Triglycerides: 80 mg/dL (ref <150)

## 2021-06-07 ENCOUNTER — Other Ambulatory Visit: Payer: Self-pay | Admitting: Family Medicine

## 2021-08-11 ENCOUNTER — Telehealth: Payer: Self-pay

## 2021-08-11 NOTE — Telephone Encounter (Signed)
LVM for pt to CB to schedule AWV.

## 2021-08-20 DIAGNOSIS — D225 Melanocytic nevi of trunk: Secondary | ICD-10-CM | POA: Diagnosis not present

## 2021-08-20 DIAGNOSIS — Z08 Encounter for follow-up examination after completed treatment for malignant neoplasm: Secondary | ICD-10-CM | POA: Diagnosis not present

## 2021-08-20 DIAGNOSIS — Z85828 Personal history of other malignant neoplasm of skin: Secondary | ICD-10-CM | POA: Diagnosis not present

## 2021-08-20 DIAGNOSIS — L820 Inflamed seborrheic keratosis: Secondary | ICD-10-CM | POA: Diagnosis not present

## 2021-08-20 DIAGNOSIS — L814 Other melanin hyperpigmentation: Secondary | ICD-10-CM | POA: Diagnosis not present

## 2021-08-20 DIAGNOSIS — L821 Other seborrheic keratosis: Secondary | ICD-10-CM | POA: Diagnosis not present

## 2021-08-20 DIAGNOSIS — L570 Actinic keratosis: Secondary | ICD-10-CM | POA: Diagnosis not present

## 2021-08-20 DIAGNOSIS — L538 Other specified erythematous conditions: Secondary | ICD-10-CM | POA: Diagnosis not present

## 2021-08-20 DIAGNOSIS — D485 Neoplasm of uncertain behavior of skin: Secondary | ICD-10-CM | POA: Diagnosis not present

## 2021-08-20 DIAGNOSIS — R208 Other disturbances of skin sensation: Secondary | ICD-10-CM | POA: Diagnosis not present

## 2021-08-26 ENCOUNTER — Encounter: Payer: Self-pay | Admitting: Family Medicine

## 2021-08-29 ENCOUNTER — Other Ambulatory Visit: Payer: Self-pay | Admitting: Family Medicine

## 2021-09-07 ENCOUNTER — Encounter: Payer: Self-pay | Admitting: Family Medicine

## 2021-10-15 DIAGNOSIS — L570 Actinic keratosis: Secondary | ICD-10-CM | POA: Diagnosis not present

## 2021-12-05 ENCOUNTER — Other Ambulatory Visit: Payer: Self-pay | Admitting: Family Medicine

## 2021-12-10 ENCOUNTER — Ambulatory Visit (INDEPENDENT_AMBULATORY_CARE_PROVIDER_SITE_OTHER): Payer: Medicare HMO | Admitting: Family Medicine

## 2021-12-10 ENCOUNTER — Other Ambulatory Visit: Payer: Self-pay

## 2021-12-10 ENCOUNTER — Encounter: Payer: Self-pay | Admitting: Family Medicine

## 2021-12-10 VITALS — BP 154/83 | HR 64 | Temp 97.4°F | Ht 69.0 in | Wt 169.4 lb

## 2021-12-10 DIAGNOSIS — R7301 Impaired fasting glucose: Secondary | ICD-10-CM | POA: Diagnosis not present

## 2021-12-10 DIAGNOSIS — E7800 Pure hypercholesterolemia, unspecified: Secondary | ICD-10-CM

## 2021-12-10 DIAGNOSIS — I10 Essential (primary) hypertension: Secondary | ICD-10-CM

## 2021-12-10 NOTE — Progress Notes (Signed)
OFFICE VISIT  12/10/2021  CC:  Chief Complaint  Patient presents with   Follow-up    RCI; fasting    HPI:    Patient is a 81 y.o. male who presents for 6 mo f/u HTN, HLD, IFG. A/P as of last visit: "1) HTN:  mild elev systolics in office consistently but home avg very near normal. Shared decision-making process utilized today and we agree no change in bp med at this time. Cont home bp monitoring, call if signif chances. Lytes/cr today.   2) HLD: tolerating rosuva 5 qd. FLP and hepatic panel today.   3) IFG, longstanding, mild.  Hba1cs have only been ULN. Plan repeat a1c 6 mo. Fasting glucose today.   4) Health maintenance exam: Reviewed age and gender appropriate health maintenance issues (prudent diet, regular exercise, health risks of tobacco and excessive alcohol, use of seatbelts, fire alarms in home, use of sunscreen).  Also reviewed age and gender appropriate health screening as well as vaccine recommendations. Vaccines: ALL UTD. Labs: cbc, cmet, flp, a1c. Prostate ca screening: no further prostate ca screening indicated. Colon ca screening: no further colon ca screening indicated."  INTERIM HX: He says he feels good.  He does home blood pressure monitoring and says after our last visit had been consistently less than 130/80.  However did not exercise regularly for few months and noted his blood pressure go back up around the mid to upper 397Q systolic.  Diastolic stayed in the 73A consistently. He just recently started going back to the gym nearly daily.  He walks playing golf once a week.  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  Rosuvastatin 5 mg a day without problem.  ROS as above, plus--> no fevers, no CP, no SOB, no wheezing, no cough, no dizziness, no HAs, no rashes, no melena/hematochezia.  No polyuria or polydipsia.  No myalgias or arthralgias.  No focal weakness, paresthesias, or tremors.  No acute vision or hearing abnormalities.  No dysuria or  unusual/new urinary urgency or frequency.  No recent changes in lower legs. No n/v/d or abd pain.  No palpitations.     Past Medical History:  Diagnosis Date   Colon polyps    Repeat TCS on 09/04/2014 showed no polyps and GI MD recommended NO FURTHER COLONOSCOPIES   Hyperkalemia    ? secondary to ACE-I?  Dose decreased by 50% 02/22/16 and repeat K was in normal range.    Hyperlipidemia    Hypertension    IFG (impaired fasting glucose) 11/01/2019   gluc 110. HbAc 5.5%. A1c 5.7% 11/2020.   Increased prostate specific antigen (PSA) velocity    2018: on recheck 6 mo later PSA returned to his baseline (<2.0).  Repeat 02/2018 stable.     Nonmelanoma skin cancer 2009   sees skin MD annually   Sensorineural hearing loss (SNHL) of both ears 2018   [facility] [employer]--hearing aids.   Tinnitus aurium, left    + chronic bilat hearing loss    Past Surgical History:  Procedure Laterality Date   COLONOSCOPY  10/12/12015   mild diverticulosis.  No polyps.  No further screening colonoscopies recommended ([facility])   COLONOSCOPY W/ POLYPECTOMY  06/2009   adenomatous polyps; recall 06/2014 [facility] [place]   [place] Left 2001   TONSILLECTOMY     US CAROTID DOPPLER BILATERAL ([place] [facility])  10/26/2019   No signif stenosis on either side.  vert's patent with antegrade flow    Outpatient Medications  Prior to Visit  Medication Sig Dispense Refill   hydrochlorothiazide (MICROZIDE) 12.5 MG capsule TAKE 1 CAPSULE DAILY 90 capsule 1   rosuvastatin (CRESTOR) 5 MG tablet Take 1 tablet (5 mg total) by mouth daily. 90 tablet 1   verapamil (CALAN-SR) 240 MG CR tablet TAKE 1 TABLET DAILY 90 tablet 1   ELDERBERRY PO Take by mouth. Take 1 gummy daily.     VITAMIN D PO Take by mouth daily.     No facility-administered medications prior to visit.    No Known Allergies  ROS As per HPI  PE: Vitals with BMI 12/10/2021 06/04/2021 11/29/2020  Height 5\' 9"  5\' 9"   5\' 9"   Weight 169 lbs 6 oz 171 lbs 3 oz 177 lbs 3 oz  BMI 25 70.17 79.39  Systolic 030 092 330  Diastolic 83 77 82  Pulse 64 70 65     Physical Exam  Gen: Alert, well appearing.  Patient is oriented to person, place, time, and situation. AFFECT: pleasant, lucid thought and speech. CV: RRR, no m/r/g.   LUNGS: CTA bilat, nonlabored resps, good aeration in all lung fields. EXT: no clubbing or cyanosis.  no edema.    LABS:  Last CBC Lab Results  Component Value Date   WBC 6.1 06/04/2021   HGB 15.2 06/04/2021   HCT 44.8 06/04/2021   MCV 95.6 06/04/2021   RDW 13.5 06/04/2021   PLT 227.0 07/62/2633   Last metabolic panel Lab Results  Component Value Date   GLUCOSE 98 06/04/2021   NA 141 06/04/2021   K 4.3 06/04/2021   CL 105 06/04/2021   CO2 28 06/04/2021   BUN 19 06/04/2021   CREATININE 1.06 06/04/2021   CALCIUM 9.1 06/04/2021   PROT 6.6 06/04/2021   ALBUMIN 4.2 11/29/2020   BILITOT 0.4 06/04/2021   ALKPHOS 59 11/29/2020   AST 14 06/04/2021   ALT 12 06/04/2021   Last lipids Lab Results  Component Value Date   CHOL 191 06/04/2021   HDL 68 06/04/2021   LDLCALC 106 (H) 06/04/2021   TRIG 80 06/04/2021   CHOLHDL 2.8 06/04/2021   Last hemoglobin A1c Lab Results  Component Value Date   HGBA1C 5.7 11/29/2020   Last thyroid functions Lab Results  Component Value Date   TSH 3.14 02/19/2016   Lab Results  Component Value Date   PSA 1.64 05/01/2020   PSA 2.08 05/03/2019   PSA 2.23 03/09/2018   IMPRESSION AND PLAN:  #1 hypertension, borderline elevated consistently at home.  His systolic blood pressure seems to get 5-10 points lower with regular exercise at the gym.  His preference is to resume this habit and try to lose a little weight and see if BP goes back down as usual. Will continue home monitoring and call if consistently greater than 140/90. Continue HCTZ 12.5 mg daily. Electrolytes and creatinine today.  2.  Hyperlipidemia.  Tolerating rosuvastatin  5 mg a day. LDL goal around 100. Most recent LDL about 6 months ago was 106. Lipid panel and hepatic panel today.  3) IFG, longstanding, mild.  Hba1cs have only been ULN, most recently 1 year ago. Hemoglobin A1c and fasting glucose today.  An After Visit Summary was printed and given to the patient.  FOLLOW UP: Return in about 6 months (around 06/09/2022) for annual CPE (fasting).  Signed:  Crissie Sickles, MD           12/10/2021

## 2021-12-11 LAB — COMPREHENSIVE METABOLIC PANEL
AG Ratio: 1.7 (calc) (ref 1.0–2.5)
ALT: 14 U/L (ref 9–46)
AST: 18 U/L (ref 10–35)
Albumin: 4.3 g/dL (ref 3.6–5.1)
Alkaline phosphatase (APISO): 63 U/L (ref 35–144)
BUN: 16 mg/dL (ref 7–25)
CO2: 29 mmol/L (ref 20–32)
Calcium: 9.8 mg/dL (ref 8.6–10.3)
Chloride: 103 mmol/L (ref 98–110)
Creat: 1.08 mg/dL (ref 0.70–1.22)
Globulin: 2.5 g/dL (calc) (ref 1.9–3.7)
Glucose, Bld: 113 mg/dL — ABNORMAL HIGH (ref 65–99)
Potassium: 4.6 mmol/L (ref 3.5–5.3)
Sodium: 142 mmol/L (ref 135–146)
Total Bilirubin: 0.5 mg/dL (ref 0.2–1.2)
Total Protein: 6.8 g/dL (ref 6.1–8.1)

## 2021-12-11 LAB — LIPID PANEL
Cholesterol: 203 mg/dL — ABNORMAL HIGH (ref <200)
HDL: 65 mg/dL (ref >=40)
LDL Cholesterol (Calc): 112 mg/dL (calc) — ABNORMAL HIGH
Non-HDL Cholesterol (Calc): 138 mg/dL (calc) — ABNORMAL HIGH (ref <130)
Total CHOL/HDL Ratio: 3.1 (calc) (ref <5.0)
Triglycerides: 138 mg/dL (ref <150)

## 2021-12-11 LAB — HEMOGLOBIN A1C
Hgb A1c MFr Bld: 5.7 % of total Hgb — ABNORMAL HIGH (ref <5.7)
Mean Plasma Glucose: 117 mg/dL
eAG (mmol/L): 6.5 mmol/L

## 2021-12-28 ENCOUNTER — Encounter: Payer: Self-pay | Admitting: Family Medicine

## 2021-12-30 MED ORDER — HYDROCHLOROTHIAZIDE 12.5 MG PO CAPS: 12.5000 mg | ORAL_CAPSULE | Freq: Every day | ORAL | 1 refills | Status: DC

## 2021-12-30 MED ORDER — ROSUVASTATIN CALCIUM 5 MG PO TABS: 5.0000 mg | ORAL_TABLET | Freq: Every day | ORAL | 1 refills | Status: DC

## 2022-02-18 ENCOUNTER — Telehealth: Payer: Self-pay

## 2022-02-18 NOTE — Telephone Encounter (Signed)
Spoke with pt to schedule AWV in office. Patient declined to schedule wellness visit at this time.   

## 2022-06-10 ENCOUNTER — Encounter: Payer: Medicare HMO | Admitting: Family Medicine

## 2022-06-19 ENCOUNTER — Other Ambulatory Visit: Payer: Self-pay | Admitting: Family Medicine

## 2022-07-06 ENCOUNTER — Other Ambulatory Visit: Payer: Self-pay | Admitting: Family Medicine

## 2022-07-17 ENCOUNTER — Ambulatory Visit (INDEPENDENT_AMBULATORY_CARE_PROVIDER_SITE_OTHER): Payer: Medicare HMO | Admitting: Family Medicine

## 2022-07-17 ENCOUNTER — Encounter: Payer: Self-pay | Admitting: Family Medicine

## 2022-07-17 ENCOUNTER — Telehealth: Payer: Self-pay

## 2022-07-17 VITALS — BP 150/72 | HR 66 | Temp 97.7°F | Ht 69.75 in | Wt 169.6 lb

## 2022-07-17 DIAGNOSIS — E7800 Pure hypercholesterolemia, unspecified: Secondary | ICD-10-CM | POA: Diagnosis not present

## 2022-07-17 DIAGNOSIS — Z0000 Encounter for general adult medical examination without abnormal findings: Secondary | ICD-10-CM | POA: Diagnosis not present

## 2022-07-17 DIAGNOSIS — I10 Essential (primary) hypertension: Secondary | ICD-10-CM | POA: Diagnosis not present

## 2022-07-17 DIAGNOSIS — Z23 Encounter for immunization: Secondary | ICD-10-CM

## 2022-07-17 LAB — COMPREHENSIVE METABOLIC PANEL
ALT: 12 U/L (ref 0–53)
AST: 16 U/L (ref 0–37)
Albumin: 4.2 g/dL (ref 3.5–5.2)
Alkaline Phosphatase: 65 U/L (ref 39–117)
BUN: 18 mg/dL (ref 6–23)
CO2: 31 mEq/L (ref 19–32)
Calcium: 9.4 mg/dL (ref 8.4–10.5)
Chloride: 102 mEq/L (ref 96–112)
Creatinine, Ser: 1.02 mg/dL (ref 0.40–1.50)
GFR: 68.23 mL/min (ref >60.00)
Glucose, Bld: 104 mg/dL — ABNORMAL HIGH (ref 70–99)
Potassium: 4.7 mEq/L (ref 3.5–5.1)
Sodium: 138 mEq/L (ref 135–145)
Total Bilirubin: 0.6 mg/dL (ref 0.2–1.2)
Total Protein: 6.7 g/dL (ref 6.0–8.3)

## 2022-07-17 LAB — CBC
HCT: 44.4 % (ref 39.0–52.0)
Hemoglobin: 14.8 g/dL (ref 13.0–17.0)
MCHC: 33.2 g/dL (ref 30.0–36.0)
MCV: 96.9 fl (ref 78.0–100.0)
Platelets: 233 10*3/uL (ref 150.0–400.0)
RBC: 4.58 Mil/uL (ref 4.22–5.81)
RDW: 13.8 % (ref 11.5–15.5)
WBC: 5.9 10*3/uL (ref 4.0–10.5)

## 2022-07-17 LAB — LIPID PANEL
Cholesterol: 188 mg/dL (ref 0–200)
HDL: 72.3 mg/dL (ref >39.00)
LDL Cholesterol: 101 mg/dL — ABNORMAL HIGH (ref 0–99)
NonHDL: 115.74
Total CHOL/HDL Ratio: 3
Triglycerides: 73 mg/dL (ref 0.0–149.0)
VLDL: 14.6 mg/dL (ref 0.0–40.0)

## 2022-07-17 MED ORDER — HYDROCHLOROTHIAZIDE 12.5 MG PO CAPS: 12.5000 mg | ORAL_CAPSULE | Freq: Every day | ORAL | 3 refills | Status: DC

## 2022-07-17 NOTE — Telephone Encounter (Signed)
Spoke with pt to schedule AWV in office. Patient declined to schedule wellness visit at this time.   

## 2022-07-17 NOTE — Patient Instructions (Signed)
Health Maintenance, Male Adopting a healthy lifestyle and getting preventive care are important in promoting health and wellness. Ask your health care provider about: The right schedule for you to have regular tests and exams. Things you can do on your own to prevent diseases and keep yourself healthy. What should I know about diet, weight, and exercise? Eat a healthy diet  Eat a diet that includes plenty of vegetables, fruits, low-fat dairy products, and lean protein. Do not eat a lot of foods that are high in solid fats, added sugars, or sodium. Maintain a healthy weight Body mass index (BMI) is a measurement that can be used to identify possible weight problems. It estimates body fat based on height and weight. Your health care provider can help determine your BMI and help you achieve or maintain a healthy weight. Get regular exercise Get regular exercise. This is one of the most important things you can do for your health. Most adults should: Exercise for at least 150 minutes each week. The exercise should increase your heart rate and make you sweat (moderate-intensity exercise). Do strengthening exercises at least twice a week. This is in addition to the moderate-intensity exercise. Spend less time sitting. Even light physical activity can be beneficial. Watch cholesterol and blood lipids Have your blood tested for lipids and cholesterol at 83 years of age, then have this test every 5 years. You may need to have your cholesterol levels checked more often if: Your lipid or cholesterol levels are high. You are older than 83 years of age. You are at high risk for heart disease. What should I know about cancer screening? Many types of cancers can be detected early and may often be prevented. Depending on your health history and family history, you may need to have cancer screening at various ages. This may include screening for: Colorectal cancer. Prostate cancer. Skin cancer. Lung  cancer. What should I know about heart disease, diabetes, and high blood pressure? Blood pressure and heart disease High blood pressure causes heart disease and increases the risk of stroke. This is more likely to develop in people who have high blood pressure readings or are overweight. Talk with your health care provider about your target blood pressure readings. Have your blood pressure checked: Every 3-5 years if you are 18-39 years of age. Every year if you are 40 years old or older. If you are between the ages of 65 and 75 and are a current or former smoker, ask your health care provider if you should have a one-time screening for abdominal aortic aneurysm (AAA). Diabetes Have regular diabetes screenings. This checks your fasting blood sugar level. Have the screening done: Once every three years after age 45 if you are at a normal weight and have a low risk for diabetes. More often and at a younger age if you are overweight or have a high risk for diabetes. What should I know about preventing infection? Hepatitis B If you have a higher risk for hepatitis B, you should be screened for this virus. Talk with your health care provider to find out if you are at risk for hepatitis B infection. Hepatitis C Blood testing is recommended for: Everyone born from 1945 through 1965. Anyone with known risk factors for hepatitis C. Sexually transmitted infections (STIs) You should be screened each year for STIs, including gonorrhea and chlamydia, if: You are sexually active and are younger than 83 years of age. You are older than 83 years of age and your   health care provider tells you that you are at risk for this type of infection. Your sexual activity has changed since you were last screened, and you are at increased risk for chlamydia or gonorrhea. Ask your health care provider if you are at risk. Ask your health care provider about whether you are at high risk for HIV. Your health care provider  may recommend a prescription medicine to help prevent HIV infection. If you choose to take medicine to prevent HIV, you should first get tested for HIV. You should then be tested every 3 months for as long as you are taking the medicine. Follow these instructions at home: Alcohol use Do not drink alcohol if your health care provider tells you not to drink. If you drink alcohol: Limit how much you have to 0-2 drinks a day. Know how much alcohol is in your drink. In the [place] one drink equals one 12 oz bottle of beer (355 mL), one 5 oz glass of wine (148 mL), or one 1 oz glass of hard liquor (44 mL). Lifestyle Do not use any products that contain nicotine or tobacco. These products include cigarettes, chewing tobacco, and vaping devices, such as e-cigarettes. If you need help quitting, ask your health care provider. Do not use street drugs. Do not share needles. Ask your health care provider for help if you need support or information about quitting drugs. General instructions Schedule regular health, dental, and eye exams. Stay current with your vaccines. Tell your health care provider if: You often feel depressed. You have ever been abused or do not feel safe at home. Summary Adopting a healthy lifestyle and getting preventive care are important in promoting health and wellness. Follow your health care provider's instructions about healthy diet, exercising, and getting tested or screened for diseases. Follow your health care provider's instructions on monitoring your cholesterol and blood pressure. This information is not intended to replace advice given to you by your health care provider. Make sure you discuss any questions you have with your health care provider. Document Revised: 04/01/2021 Document Reviewed: 04/01/2021 Elsevier Patient Education  2023 [employer]  

## 2022-07-17 NOTE — Progress Notes (Signed)
Office Note 07/17/2022  CC:  Chief Complaint  Patient presents with   Annual Exam    Pt is fasting   HPI:  Patient is a 81 y.o. male who is here for annual health maintenance exam and follow-up hypertension and hypercholesterolemia.  Daxter feels well. Home blood pressures reviewed today in the average 1 35-3 35 systolic over 61W to 43X diastolic.  This is what it has been in the past and he has preferred to stay at his current dose of HCTZ 12.5 mg daily. He works out at [employer] several days a week doing cardio and light weights.  He also walks playing golf, 18 holes most of the time, several days a week.  Past Medical History:  Diagnosis Date   Colon polyps    Repeat TCS on 09/04/2014 showed no polyps and GI MD recommended NO FURTHER COLONOSCOPIES   Hyperkalemia    ? secondary to ACE-I?  Dose decreased by 50% 02/22/16 and repeat K was in normal range.    Hyperlipidemia    Hypertension    IFG (impaired fasting glucose) 11/01/2019   gluc 110. HbAc 5.5%. A1c 5.7% 11/2020.   Increased prostate specific antigen (PSA) velocity    2018: on recheck 6 mo later PSA returned to his baseline (<2.0).  Repeat 02/2018 stable.     Nonmelanoma skin cancer 2009   sees skin MD annually   Sensorineural hearing loss (SNHL) of both ears 2018   [facility] [place]--hearing aids.   Tinnitus aurium, left    + chronic bilat hearing loss    Past Surgical History:  Procedure Laterality Date   COLONOSCOPY  10/12/12015   mild diverticulosis.  No polyps.  No further screening colonoscopies recommended ([facility])   COLONOSCOPY W/ POLYPECTOMY  06/2009   adenomatous polyps; [place] 06[place] [place] [place]   [place] Left 2001   TONSILLECTOMY     US CAROTID DOPPLER BILATERAL ([place] [facility])  10/26/2019   No signif stenosis on either side.  vert's patent with antegrade flow    Family History  Problem Relation Age of Onset   Heart disease Mother     Hypertension Mother     Social History   Socioeconomic History   Marital status: Married    Spouse name: Not on file   Number of children: Not on file   Years of education: Not on file   Highest education level: 12th grade  Occupational History   Not on file  Tobacco Use   Smoking status: Never   Smokeless tobacco: Never  Substance and Sexual Activity   Alcohol use: Yes    Alcohol/week: 1.0 standard drink of alcohol    Types: 1 Glasses of wine per week    Comment: glass of wine daily   Drug use: No   Sexual activity: Not on file  Other Topics Concern   Not on file  Social History Narrative   Married, has one adopted son.   Occupation:  Retired Chief Executive Officer for [employer].   Never smoker, alcohol--1-2 glasses of wine per day.     No hx of alc abuse or drug use.   Social Determinants of Health   Financial Resource Strain: Low Risk  (12/07/2021)   Overall Financial Resource Strain (CARDIA)    Difficulty of Paying Living Expenses: Not hard at all  Food Insecurity: No Food Insecurity (12/07/2021)   Hunger Vital Sign    Worried About Running Out of Food in the  Last Year: Never true    [place] in the Last Year: Never true  Transportation Needs: No Transportation Needs (12/07/2021)   PRAPARE - Hydrologist (Medical): No    Lack of Transportation (Non-Medical): No  Physical Activity: Insufficiently Active (12/07/2021)   Exercise Vital Sign    Days of Exercise per Week: 3 days    Minutes of Exercise per Session: 40 min  Stress: No Stress Concern Present (12/07/2021)   [place]    Feeling of Stress : Not at all  Social Connections: Moderately Isolated (12/07/2021)   Social Connection and Isolation Panel [NHANES]    Frequency of Communication with Friends and Family: More than three times a week    Frequency of Social Gatherings with Friends and Family: Twice a week     Attends Religious Services: Never    Marine scientist or Organizations: No    Attends Music therapist: Not on file    Marital Status: Married  Human resources officer Violence: Not on file    Outpatient Medications Prior to Visit  Medication Sig Dispense Refill   ASPIRIN 81 PO Take 81 mg by mouth daily.     rosuvastatin (CRESTOR) 5 MG tablet TAKE 1 TABLET DAILY 90 tablet 0   verapamil (CALAN-SR) 240 MG CR tablet TAKE 1 TABLET DAILY 90 tablet 0   hydrochlorothiazide (MICROZIDE) 12.5 MG capsule Take 1 capsule (12.5 mg total) by mouth daily. 90 capsule 1   No facility-administered medications prior to visit.    No Known Allergies  ROS Review of Systems  Constitutional:  Negative for appetite change, chills, fatigue and fever.  HENT:  Negative for congestion, dental problem, ear pain and sore throat.   Eyes:  Negative for discharge, redness and visual disturbance.  Respiratory:  Negative for cough, chest tightness, shortness of breath and wheezing.   Cardiovascular:  Negative for chest pain, palpitations and leg swelling.  Gastrointestinal:  Negative for abdominal pain, blood in stool, diarrhea, nausea and vomiting.  Genitourinary:  Negative for difficulty urinating, dysuria, flank pain, frequency, hematuria and urgency.  Musculoskeletal:  Negative for arthralgias, back pain, joint swelling, myalgias and neck stiffness.  Skin:  Negative for pallor and rash.  Neurological:  Negative for dizziness, speech difficulty, weakness and headaches.  Hematological:  Negative for adenopathy. Does not bruise/bleed easily.  Psychiatric/Behavioral:  Negative for confusion and sleep disturbance. The patient is not nervous/anxious.     PE;    07/17/2022   10:51 AM 07/17/2022   10:43 AM 12/10/2021   10:51 AM  Vitals with BMI  Height  5' 9.75" '5\' 9"'$   Weight  169 lbs 10 oz 169 lbs 6 oz  BMI  11.9 25  Systolic 147 829 562  Diastolic 72 73 83  Pulse  66 64   Gen: Alert, well  appearing.  Patient is oriented to person, place, time, and situation. AFFECT: pleasant, lucid thought and speech. ENT: Ears: EACs clear, normal epithelium.  TMs with good light reflex and landmarks bilaterally.  Eyes: no injection, icteris, swelling, or exudate.  EOMI, PERRLA. Nose: no drainage or turbinate edema/swelling.  No injection or focal lesion.  Mouth: lips without lesion/swelling.  Oral mucosa pink and moist.  Dentition intact and without obvious caries or gingival swelling.  Oropharynx without erythema, exudate, or swelling.  Neck: supple/nontender.  No LAD, mass, or TM.  Carotid pulses 2+ bilaterally, without bruits. CV: RRR,  no m/r/g.   LUNGS: CTA bilat, nonlabored resps, good aeration in all lung fields. ABD: soft, NT, ND, BS normal.  No hepatospenomegaly or mass.  No bruits. EXT: no clubbing, cyanosis, or edema.  Musculoskeletal: no joint swelling, erythema, warmth, or tenderness.  ROM of all joints intact. Skin - no sores or suspicious lesions or rashes or color changes   Pertinent labs:  Lab Results  Component Value Date   TSH 3.14 02/19/2016   Lab Results  Component Value Date   WBC 6.1 06/04/2021   HGB 15.2 06/04/2021   HCT 44.8 06/04/2021   MCV 95.6 06/04/2021   PLT 227.0 06/04/2021   Lab Results  Component Value Date   CREATININE 1.08 12/10/2021   BUN 16 12/10/2021   NA 142 12/10/2021   K 4.6 12/10/2021   CL 103 12/10/2021   CO2 29 12/10/2021   Lab Results  Component Value Date   ALT 14 12/10/2021   AST 18 12/10/2021   ALKPHOS 59 11/29/2020   BILITOT 0.5 12/10/2021   Lab Results  Component Value Date   CHOL 203 (H) 12/10/2021   Lab Results  Component Value Date   HDL 65 12/10/2021   Lab Results  Component Value Date   LDLCALC 112 (H) 12/10/2021   Lab Results  Component Value Date   TRIG 138 12/10/2021   Lab Results  Component Value Date   CHOLHDL 3.1 12/10/2021   Lab Results  Component Value Date   PSA 1.64 05/01/2020   PSA 2.08  05/03/2019   PSA 2.23 03/09/2018   Lab Results  Component Value Date   HGBA1C 5.7 (H) 12/10/2021   ASSESSMENT AND PLAN:   1 hypertension, borderline control.  We discussed option of continue with current dose of medication at this time versus increasing to the 25 mg daily dose of HCTZ but he prefers to hold off on increase at this time. Electrolytes and creatinine today  2.  Hypercholesterolemia.  Tolerating rosuvastatin 5 mg daily. Lipid panel and hepatic panel today.  #3 Health maintenance exam: Reviewed age and gender appropriate health maintenance issues (prudent diet, regular exercise, health risks of tobacco and excessive alcohol, use of seatbelts, fire alarms in home, use of sunscreen).  Also reviewed age and gender appropriate health screening as well as vaccine recommendations. Vaccines: Prevnar 20 today.  He will be getting flu vaccine and RSV vaccine soon. He wants to put off shingles vaccine until next year. Labs: cbc, cmet, flp Prostate ca screening: no further prostate ca screening indicated. Colon ca screening: no further colon ca screening indicated.  IFG: plan recheck Hba1c 6 mo.  An After Visit Summary was printed and given to the patient.  FOLLOW UP:  Return in about 6 months (around 01/17/2023) for routine chronic illness f/u.  Signed:  Crissie Sickles, MD           07/17/2022

## 2022-07-31 ENCOUNTER — Telehealth: Payer: Self-pay

## 2022-07-31 NOTE — Telephone Encounter (Signed)
Spoke with pt to schedule AWV in office. Patient declined to schedule wellness visit at this time.   

## 2022-08-19 DIAGNOSIS — H02834 Dermatochalasis of left upper eyelid: Secondary | ICD-10-CM | POA: Diagnosis not present

## 2022-08-19 DIAGNOSIS — H02831 Dermatochalasis of right upper eyelid: Secondary | ICD-10-CM | POA: Diagnosis not present

## 2022-08-21 DIAGNOSIS — L814 Other melanin hyperpigmentation: Secondary | ICD-10-CM | POA: Diagnosis not present

## 2022-08-21 DIAGNOSIS — L821 Other seborrheic keratosis: Secondary | ICD-10-CM | POA: Diagnosis not present

## 2022-08-21 DIAGNOSIS — L570 Actinic keratosis: Secondary | ICD-10-CM | POA: Diagnosis not present

## 2022-08-21 DIAGNOSIS — Z08 Encounter for follow-up examination after completed treatment for malignant neoplasm: Secondary | ICD-10-CM | POA: Diagnosis not present

## 2022-08-21 DIAGNOSIS — Z85828 Personal history of other malignant neoplasm of skin: Secondary | ICD-10-CM | POA: Diagnosis not present

## 2022-08-21 DIAGNOSIS — D225 Melanocytic nevi of trunk: Secondary | ICD-10-CM | POA: Diagnosis not present

## 2022-09-02 DIAGNOSIS — H40053 Ocular hypertension, bilateral: Secondary | ICD-10-CM | POA: Diagnosis not present

## 2022-09-02 DIAGNOSIS — H25812 Combined forms of age-related cataract, left eye: Secondary | ICD-10-CM | POA: Diagnosis not present

## 2022-09-02 DIAGNOSIS — H2511 Age-related nuclear cataract, right eye: Secondary | ICD-10-CM | POA: Diagnosis not present

## 2022-09-17 DIAGNOSIS — H401111 Primary open-angle glaucoma, right eye, mild stage: Secondary | ICD-10-CM | POA: Diagnosis not present

## 2022-09-17 DIAGNOSIS — H25812 Combined forms of age-related cataract, left eye: Secondary | ICD-10-CM | POA: Diagnosis not present

## 2022-09-17 DIAGNOSIS — H401121 Primary open-angle glaucoma, left eye, mild stage: Secondary | ICD-10-CM | POA: Diagnosis not present

## 2022-09-17 DIAGNOSIS — H25811 Combined forms of age-related cataract, right eye: Secondary | ICD-10-CM | POA: Diagnosis not present

## 2022-10-06 DIAGNOSIS — H269 Unspecified cataract: Secondary | ICD-10-CM | POA: Diagnosis not present

## 2022-10-06 DIAGNOSIS — H40052 Ocular hypertension, left eye: Secondary | ICD-10-CM | POA: Diagnosis not present

## 2022-10-06 DIAGNOSIS — H409 Unspecified glaucoma: Secondary | ICD-10-CM | POA: Diagnosis not present

## 2022-10-06 DIAGNOSIS — H25812 Combined forms of age-related cataract, left eye: Secondary | ICD-10-CM | POA: Diagnosis not present

## 2022-10-06 HISTORY — PX: CATARACT EXTRACTION: SUR2

## 2022-10-11 ENCOUNTER — Other Ambulatory Visit: Payer: Self-pay | Admitting: Family Medicine

## 2022-10-14 ENCOUNTER — Other Ambulatory Visit: Payer: Self-pay | Admitting: Family Medicine

## 2022-10-14 ENCOUNTER — Encounter: Payer: Self-pay | Admitting: Family Medicine

## 2022-10-20 DIAGNOSIS — H25811 Combined forms of age-related cataract, right eye: Secondary | ICD-10-CM | POA: Diagnosis not present

## 2022-10-20 DIAGNOSIS — H40051 Ocular hypertension, right eye: Secondary | ICD-10-CM | POA: Diagnosis not present

## 2022-10-20 DIAGNOSIS — H409 Unspecified glaucoma: Secondary | ICD-10-CM | POA: Diagnosis not present

## 2022-10-20 DIAGNOSIS — H269 Unspecified cataract: Secondary | ICD-10-CM | POA: Diagnosis not present

## 2022-10-20 HISTORY — PX: CATARACT EXTRACTION: SUR2

## 2022-11-21 ENCOUNTER — Encounter: Payer: Self-pay | Admitting: Family Medicine

## 2022-11-21 ENCOUNTER — Ambulatory Visit (INDEPENDENT_AMBULATORY_CARE_PROVIDER_SITE_OTHER): Payer: Medicare HMO | Admitting: Family Medicine

## 2022-11-21 VITALS — BP 163/64 | HR 72 | Temp 97.4°F | Ht 69.75 in | Wt 168.2 lb

## 2022-11-21 DIAGNOSIS — R21 Rash and other nonspecific skin eruption: Secondary | ICD-10-CM | POA: Diagnosis not present

## 2022-11-21 MED ORDER — PREDNISONE 10 MG PO TABS: ORAL_TABLET | ORAL | 0 refills | Status: DC

## 2022-11-21 NOTE — Patient Instructions (Signed)
Take one over the counter zyrtec tab daily for 10 days

## 2022-11-21 NOTE — Progress Notes (Signed)
OFFICE VISIT  11/21/2022  CC:  Chief Complaint  Patient presents with   Rash    Pt c/o rash on his back for the past    Patient is a 81 y.o. male who presents for rash.  HPI: About 4 weeks ago daughter noticed a rash in the central area of his chest, itchy.  It was little reddish bumps.  It gradually has spread to the rest of the chest and abdomen.  Also began to spread over his back.  There is nothing on his face, shoulders, sides, arms, GU region, buttocks, or legs. He tried various over-the-counter topical treatments, including hydrocortisone.  Nothing helps. No pain or burning. He has not felt sick at all.  Prior to the rash he had not had any recent viral syndrome. Denies known contact irritant or allergen lately.   Past Medical History:  Diagnosis Date   Colon polyps    Repeat TCS on 09/04/2014 showed no polyps and GI MD recommended NO FURTHER COLONOSCOPIES   Hyperkalemia    ? secondary to ACE-I?  Dose decreased by 50% 02/22/16 and repeat K was in normal range.    Hyperlipidemia    Hypertension    IFG (impaired fasting glucose) 11/01/2019   gluc 110. HbAc 5.5%. A1c 5.7% 11/2020.   Increased prostate specific antigen (PSA) velocity    2018: on recheck 6 mo later PSA returned to his baseline (<2.0).  Repeat 02/2018 stable.     Nonmelanoma skin cancer 2009   sees skin MD annually   Sensorineural hearing loss (SNHL) of both ears 2018   [facility] [place]--hearing aids.   Tinnitus aurium, left    + chronic bilat hearing loss    Past Surgical History:  Procedure Laterality Date   CATARACT EXTRACTION Left 10/06/2022   CATARACT EXTRACTION Right 10/20/2022   COLONOSCOPY  10/12/12015   mild diverticulosis.  No polyps.  No further screening colonoscopies recommended ([facility])   COLONOSCOPY W/ POLYPECTOMY  06/2009   adenomatous polyps; recall 06/2014 [facility] [place]   INGUINAL HERNIA REPAIR Left 2001   TONSILLECTOMY     US CAROTID  DOPPLER BILATERAL ([place] [facility])  10/26/2019   No signif stenosis on either side.  vert's patent with antegrade flow    Outpatient Medications Prior to Visit  Medication Sig Dispense Refill   ASPIRIN 81 PO Take 81 mg by mouth daily.     hydrochlorothiazide (MICROZIDE) 12.5 MG capsule Take 1 capsule (12.5 mg total) by mouth daily. 90 capsule 3   rosuvastatin (CRESTOR) 5 MG tablet TAKE 1 TABLET DAILY 90 tablet 0   verapamil (CALAN-SR) 240 MG CR tablet TAKE 1 TABLET DAILY 90 tablet 0   No facility-administered medications prior to visit.    No Known Allergies  Review of Systems  As per HPI  PE:    11/21/2022    2:33 PM 11/21/2022    2:27 PM 07/17/2022   10:51 AM  Vitals with BMI  Height  5' 9.75"   Weight  168 lbs 3 oz   BMI  78.6   Systolic 767 209 470  Diastolic 64 72 72  Pulse  72      Physical Exam  General: Alert, well appearing, pleasant. Skin: Scattered fine, excoriated pinkish papules on chest and abdomen as well as on the back.  The remainder of his skin is without rash.  LABS:  Last metabolic panel Lab Results  Component Value Date   GLUCOSE 104 (H) 07/17/2022  NA 138 07/17/2022   K 4.7 07/17/2022   CL 102 07/17/2022   CO2 31 07/17/2022   BUN 18 07/17/2022   CREATININE 1.02 07/17/2022   CALCIUM 9.4 07/17/2022   PROT 6.7 07/17/2022   ALBUMIN 4.2 07/17/2022   BILITOT 0.6 07/17/2022   ALKPHOS 65 07/17/2022   AST 16 07/17/2022   ALT 12 07/17/2022   Last hemoglobin A1c Lab Results  Component Value Date   HGBA1C 5.7 (H) 12/10/2021   IMPRESSION AND PLAN:  Nonspecific pruritic rash. Viral exanthem versus allergic/irritant dermatitis. No systemic symptoms. Recommended Zyrtec 10 mg a day and will do prednisone taper: 30 mg daily x 4 days, 20 mg daily x 4 days, 10 mg daily x 4 days. If not significantly improving towards the end of this treatment then he will return and we will do a skin biopsy.  An After Visit Summary was printed and given to the  patient.  FOLLOW UP: Return if symptoms worsen or fail to improve.  Signed:  Crissie Sickles, MD           11/21/2022

## 2022-12-03 DIAGNOSIS — H02834 Dermatochalasis of left upper eyelid: Secondary | ICD-10-CM | POA: Diagnosis not present

## 2022-12-03 DIAGNOSIS — H02831 Dermatochalasis of right upper eyelid: Secondary | ICD-10-CM | POA: Diagnosis not present

## 2022-12-10 DIAGNOSIS — R69 Illness, unspecified: Secondary | ICD-10-CM | POA: Diagnosis not present

## 2022-12-24 ENCOUNTER — Other Ambulatory Visit: Payer: Self-pay | Admitting: Family Medicine

## 2023-01-01 ENCOUNTER — Telehealth: Payer: Self-pay

## 2023-01-01 NOTE — Telephone Encounter (Signed)
LVM for pt to call back in regards to scheduling AWV with our health coach.   01/01/2023

## 2023-01-15 ENCOUNTER — Ambulatory Visit: Payer: Medicare HMO | Admitting: Family Medicine

## 2023-02-12 ENCOUNTER — Ambulatory Visit (INDEPENDENT_AMBULATORY_CARE_PROVIDER_SITE_OTHER): Payer: Medicare HMO | Admitting: Family Medicine

## 2023-02-12 ENCOUNTER — Telehealth: Payer: Self-pay

## 2023-02-12 ENCOUNTER — Encounter: Payer: Self-pay | Admitting: Family Medicine

## 2023-02-12 VITALS — BP 133/72 | HR 67 | Temp 97.9°F | Ht 69.75 in | Wt 174.0 lb

## 2023-02-12 DIAGNOSIS — I10 Essential (primary) hypertension: Secondary | ICD-10-CM

## 2023-02-12 DIAGNOSIS — E7800 Pure hypercholesterolemia, unspecified: Secondary | ICD-10-CM

## 2023-02-12 DIAGNOSIS — R7301 Impaired fasting glucose: Secondary | ICD-10-CM

## 2023-02-12 MED ORDER — VERAPAMIL HCL ER 240 MG PO TBCR
240.0000 mg | EXTENDED_RELEASE_TABLET | Freq: Every day | ORAL | 1 refills | Status: DC
Start: 2023-02-12 — End: 2023-08-19

## 2023-02-12 MED ORDER — HYDROCHLOROTHIAZIDE 12.5 MG PO CAPS
12.5000 mg | ORAL_CAPSULE | Freq: Every day | ORAL | 1 refills | Status: DC
Start: 2023-02-12 — End: 2023-08-19

## 2023-02-12 MED ORDER — ROSUVASTATIN CALCIUM 5 MG PO TABS
5.0000 mg | ORAL_TABLET | Freq: Every day | ORAL | 1 refills | Status: DC
Start: 2023-02-12 — End: 2023-08-19

## 2023-02-12 NOTE — Telephone Encounter (Signed)
Spoke with pt to schedule AWV in office. Patient declined to schedule wellness visit at this time.   

## 2023-02-12 NOTE — Progress Notes (Signed)
OFFICE VISIT  02/12/2023  CC:  Chief Complaint  Patient presents with   Medical Management of Chronic Issues    Pt is fasting.    Patient is a 82 y.o. male who presents for 83-month follow-up hypertension and hyperlipidemia. A/P as of last visit: "1 hypertension, borderline control.  We discussed option of continue with current dose of medication at this time versus increasing to the 25 mg daily dose of HCTZ but he prefers to hold off on increase at this time. Electrolytes and creatinine today   2.  Hypercholesterolemia.  Tolerating rosuvastatin 5 mg daily. Lipid panel and hepatic panel today.   #3 Health maintenance exam: Reviewed age and gender appropriate health maintenance issues (prudent diet, regular exercise, health risks of tobacco and excessive alcohol, use of seatbelts, fire alarms in home, use of sunscreen).  Also reviewed age and gender appropriate health screening as well as vaccine recommendations. Vaccines: Prevnar 20 today.  He will be getting flu vaccine and RSV vaccine soon. He wants to put off shingles vaccine until next year. Labs: cbc, cmet, flp Prostate ca screening: no further prostate ca screening indicated. Colon ca screening: no further colon ca screening indicated.   #4 IFG: plan recheck Hba1c 6 mo."  INTERIM HX: Curtis Matthews feels well. 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  Home blood pressures consistently less than 130/80.  The rash I saw him for a few months back resolved pretty quickly with prednisone and has not recurred.  ROS as above, plus--> no fevers, no CP, no SOB, no wheezing, no cough, no dizziness, no HAs, no rashes, no melena/hematochezia.  No polyuria or polydipsia.  No myalgias or arthralgias.  No focal weakness, paresthesias, or tremors.  No acute vision or hearing abnormalities.  No dysuria or unusual/new urinary urgency or  frequency.  No recent changes in lower legs. No n/v/d or abd pain.  No palpitations.    Past Medical History:  Diagnosis Date   Colon polyps    Repeat TCS on 09/04/2014 showed no polyps and GI MD recommended NO FURTHER COLONOSCOPIES   Hyperkalemia    ? secondary to ACE-I?  Dose decreased by 50% 02/22/16 and repeat K was in normal range.    Hyperlipidemia    Hypertension    IFG (impaired fasting glucose) 11/01/2019   gluc 110. HbAc 5.5%. A1c 5.7% 11/2020.   Increased prostate specific antigen (PSA) velocity    2018: on recheck 6 mo later PSA returned to his baseline (<2.0).  Repeat 02/2018 stable.     Nonmelanoma skin cancer 2009   sees skin MD annually   Sensorineural hearing loss (SNHL) of both ears 2018   [facility] [place]--hearing aids.   Tinnitus aurium, left    + chronic bilat hearing loss    Past Surgical History:  Procedure Laterality Date   CATARACT EXTRACTION Left 10/06/2022   CATARACT EXTRACTION Right 10/20/2022   COLONOSCOPY  10/12/12015   mild diverticulosis.  No polyps.  No further screening colonoscopies recommended ([facility])   COLONOSCOPY W/ POLYPECTOMY  06/2009   adenomatous polyps; recall 06/2014 [facility] [place]   INGUINAL HERNIA REPAIR Left 2001   TONSILLECTOMY     US CAROTID DOPPLER BILATERAL ([place] [facility])  10/26/2019   No signif stenosis on either side.  vert's patent with antegrade flow    Outpatient Medications  Prior to Visit  Medication Sig Dispense Refill   ASPIRIN 81 PO Take 81 mg by mouth daily.     hydrochlorothiazide (MICROZIDE) 12.5 MG capsule Take 1 capsule (12.5 mg total) by mouth daily. 90 capsule 3   rosuvastatin (CRESTOR) 5 MG tablet TAKE 1 TABLET DAILY 90 tablet 0   verapamil (CALAN-SR) 240 MG CR tablet TAKE 1 TABLET DAILY 90 tablet 0   predniSONE (DELTASONE) 10 MG tablet 3 tabs po qd x 4d then 2 tabs po qd x 4d then 1 tab po qd x 4d 24 tablet 0   No facility-administered medications prior to visit.     No Known Allergies  Review of Systems As per HPI  PE:    02/12/2023    9:42 AM 11/21/2022    2:33 PM 11/21/2022    2:27 PM  Vitals with BMI  Height 5' 9.75"  5' 9.75"  Weight 174 lbs  168 lbs 3 oz  BMI 99991111  [id number]  Systolic [id number]  Diastolic 72 64 72  Pulse 67  72     Physical Exam  Gen: Alert, well appearing.  Patient is oriented to person, place, time, and situation. CV: RRR, no m/r/g.   LUNGS: CTA bilat, nonlabored resps, good aeration in all lung fields. EXT: no clubbing or cyanosis.  no edema.    LABS:  Last CBC Lab Results  Component Value Date   WBC 5.9 07/17/2022   HGB 14.8 07/17/2022   HCT 44.4 07/17/2022   MCV 96.9 07/17/2022   RDW 13.8 07/17/2022   PLT 233.0 [id number]   Last metabolic panel Lab Results  Component Value Date   GLUCOSE 104 (H) 07/17/2022   NA 138 07/17/2022   K 4.7 07/17/2022   CL 102 07/17/2022   CO2 31 07/17/2022   BUN 18 07/17/2022   CREATININE 1.02 07/17/2022   CALCIUM 9.4 07/17/2022   PROT 6.7 07/17/2022   ALBUMIN 4.2 07/17/2022   BILITOT 0.6 07/17/2022   ALKPHOS 65 07/17/2022   AST 16 07/17/2022   ALT 12 07/17/2022   Last lipids Lab Results  Component Value Date   CHOL 188 07/17/2022   HDL 72.30 07/17/2022   LDLCALC 101 (H) 07/17/2022   TRIG 73.0 07/17/2022   CHOLHDL 3 07/17/2022   Last hemoglobin A1c Lab Results  Component Value Date   HGBA1C 5.7 (H) 12/10/2021   Lab Results  Component Value Date   PSA 1.64 05/01/2020   PSA 2.08 05/03/2019   PSA 2.23 03/09/2018   IMPRESSION AND PLAN:  #1 hypertension, well-controlled on HCTZ 12.5 mg a day. Electrolytes and creatinine today.  2 hypercholesterolemia, well-controlled on Crestor 5 mg a day. Lipid panel today.  #3 impaired fasting glucose. Hemoglobin A1c and fasting glucose today.  An After Visit Summary was printed and given to the patient.  FOLLOW UP: Return in about 6 months (around 08/15/2023) for routine chronic illness  f/u.  Signed:  Crissie Sickles, MD           02/12/2023

## 2023-02-13 LAB — LIPID PANEL
Cholesterol: 166 mg/dL (ref <200)
HDL: 68 mg/dL (ref >=40)
LDL Cholesterol (Calc): 80 mg/dL (calc)
Non-HDL Cholesterol (Calc): 98 mg/dL (calc) (ref <130)
Total CHOL/HDL Ratio: 2.4 (calc) (ref <5.0)
Triglycerides: 96 mg/dL (ref <150)

## 2023-02-13 LAB — COMPREHENSIVE METABOLIC PANEL
AG Ratio: 1.8 (calc) (ref 1.0–2.5)
ALT: 16 U/L (ref 9–46)
AST: 18 U/L (ref 10–35)
Albumin: 4.1 g/dL (ref 3.6–5.1)
Alkaline phosphatase (APISO): 64 U/L (ref 35–144)
BUN: 17 mg/dL (ref 7–25)
CO2: 26 mmol/L (ref 20–32)
Calcium: 9.3 mg/dL (ref 8.6–10.3)
Chloride: 104 mmol/L (ref 98–110)
Creat: 1 mg/dL (ref 0.70–1.22)
Globulin: 2.3 g/dL (calc) (ref 1.9–3.7)
Glucose, Bld: 100 mg/dL — ABNORMAL HIGH (ref 65–99)
Potassium: 4.3 mmol/L (ref 3.5–5.3)
Sodium: 143 mmol/L (ref 135–146)
Total Bilirubin: 0.5 mg/dL (ref 0.2–1.2)
Total Protein: 6.4 g/dL (ref 6.1–8.1)

## 2023-02-13 LAB — HEMOGLOBIN A1C
Hgb A1c MFr Bld: 5.8 % of total Hgb — ABNORMAL HIGH (ref <5.7)
Mean Plasma Glucose: 120 mg/dL
eAG (mmol/L): 6.6 mmol/L

## 2023-02-16 DIAGNOSIS — Z0100 Encounter for examination of eyes and vision without abnormal findings: Secondary | ICD-10-CM | POA: Diagnosis not present

## 2023-03-12 ENCOUNTER — Telehealth: Payer: Self-pay

## 2023-03-12 NOTE — Telephone Encounter (Signed)
Called pt to schedule AWV. Please schedule with health coach, pt has already answered question on MyChart message

## 2023-04-03 DIAGNOSIS — H401111 Primary open-angle glaucoma, right eye, mild stage: Secondary | ICD-10-CM | POA: Diagnosis not present

## 2023-04-09 DIAGNOSIS — D485 Neoplasm of uncertain behavior of skin: Secondary | ICD-10-CM | POA: Diagnosis not present

## 2023-04-09 DIAGNOSIS — L570 Actinic keratosis: Secondary | ICD-10-CM | POA: Diagnosis not present

## 2023-04-09 DIAGNOSIS — C44722 Squamous cell carcinoma of skin of right lower limb, including hip: Secondary | ICD-10-CM | POA: Diagnosis not present

## 2023-04-21 DIAGNOSIS — C44722 Squamous cell carcinoma of skin of right lower limb, including hip: Secondary | ICD-10-CM | POA: Diagnosis not present

## 2023-04-21 DIAGNOSIS — L989 Disorder of the skin and subcutaneous tissue, unspecified: Secondary | ICD-10-CM | POA: Diagnosis not present

## 2023-07-09 ENCOUNTER — Encounter (INDEPENDENT_AMBULATORY_CARE_PROVIDER_SITE_OTHER): Payer: Self-pay

## 2023-07-15 ENCOUNTER — Other Ambulatory Visit: Payer: Self-pay | Admitting: Family Medicine

## 2023-08-19 NOTE — Patient Instructions (Signed)

## 2023-08-20 ENCOUNTER — Ambulatory Visit (INDEPENDENT_AMBULATORY_CARE_PROVIDER_SITE_OTHER): Payer: Medicare HMO | Admitting: Family Medicine

## 2023-08-20 ENCOUNTER — Encounter: Payer: Self-pay | Admitting: Family Medicine

## 2023-08-20 VITALS — BP 129/76 | HR 69 | Temp 98.4°F | Ht 69.0 in | Wt 168.6 lb

## 2023-08-20 DIAGNOSIS — I10 Essential (primary) hypertension: Secondary | ICD-10-CM

## 2023-08-20 DIAGNOSIS — E7800 Pure hypercholesterolemia, unspecified: Secondary | ICD-10-CM | POA: Diagnosis not present

## 2023-08-20 DIAGNOSIS — R7303 Prediabetes: Secondary | ICD-10-CM

## 2023-08-20 DIAGNOSIS — Z0000 Encounter for general adult medical examination without abnormal findings: Secondary | ICD-10-CM | POA: Diagnosis not present

## 2023-08-20 MED ORDER — HYDROCHLOROTHIAZIDE 12.5 MG PO CAPS: 12.5000 mg | ORAL_CAPSULE | Freq: Every day | ORAL | 1 refills | Status: DC

## 2023-08-20 MED ORDER — ROSUVASTATIN CALCIUM 5 MG PO TABS: 5.0000 mg | ORAL_TABLET | Freq: Every day | ORAL | 1 refills | Status: DC

## 2023-08-20 MED ORDER — VERAPAMIL HCL ER 240 MG PO TBCR: 240.0000 mg | EXTENDED_RELEASE_TABLET | Freq: Every day | ORAL | 1 refills | Status: DC

## 2023-08-20 NOTE — Progress Notes (Signed)
Office Note 08/20/2023  CC:  Chief Complaint  Patient presents with   Annual Exam    Fasting CPE   Patient is a 82 y.o. male who is here for annual health maintenance exam and 86-month follow-up hypertension and hyperlipidemia. A/P as of last visit: "#1 hypertension, well-controlled on HCTZ 12.5 mg a day. Electrolytes and creatinine today.   2 hypercholesterolemia, well-controlled on Crestor 5 mg a day. Lipid panel today.   #3 impaired fasting glucose. Hemoglobin A1c and fasting glucose today."  INTERIM HX: Feeling well, staying active with golf, workouts, pushing [employer]'s at the [place], doing pottery.  Occasional home bp check always normal.  Past Medical History:  Diagnosis Date   Colon polyps    Repeat TCS on 09/04/2014 showed no polyps and GI MD recommended NO FURTHER COLONOSCOPIES   Hyperkalemia    ? secondary to ACE-I?  Dose decreased by 50% 02/22/16 and repeat K was in normal range.    Hyperlipidemia    Hypertension    IFG (impaired fasting glucose) 11/01/2019   gluc 110. HbAc 5.5%. A1c 5.7% 11/2020.   Increased prostate specific antigen (PSA) velocity    2018: on recheck 6 mo later PSA returned to his baseline (<2.0).  Repeat 02/2018 stable.     Nonmelanoma skin cancer 2009   sees skin MD annually   Sensorineural hearing loss (SNHL) of both ears 2018   [facility] [place]--hearing aids.   Tinnitus aurium, left    + chronic bilat hearing loss    Past Surgical History:  Procedure Laterality Date   CATARACT EXTRACTION Left 10/06/2022   CATARACT EXTRACTION Right 10/20/2022   COLONOSCOPY  10/12/12015   mild diverticulosis.  No polyps.  No further screening colonoscopies recommended ([facility])   COLONOSCOPY W/ POLYPECTOMY  06/2009   adenomatous polyps; recall 06[facility]2014 [facility] [place]   INGUINAL HERNIA REPAIR Left 2001   TONSILLECTOMY     US CAROTID DOPPLER BILATERAL ([facility] HX)  10/26/2019   No signif stenosis on either side.   vert's patent with antegrade flow    Family History  Problem Relation Age of Onset   Heart disease Mother    Hypertension Mother     Social History   Socioeconomic History   Marital status: Married    Spouse name: Not on file   Number of children: Not on file   Years of education: Not on file   Highest education level: 12th grade  Occupational History   Not on file  Tobacco Use   Smoking status: Never   Smokeless tobacco: Never  Substance and Sexual Activity   Alcohol use: Yes    Alcohol/week: 1.0 standard drink of alcohol    Types: 1 Glasses of wine per week    Comment: glass of wine daily   Drug use: No   Sexual activity: Not on file  Other Topics Concern   Not on file  Social History Narrative   Married, has one adopted son.   Occupation:  Retired Psychologist, clinical for [employer].   Never smoker, alcohol--1-2 glasses of wine per day.     No hx of alc abuse or drug use.   Social Determinants of Health   Financial Resource Strain: Low Risk  (03/11/2023)   Overall Financial Resource Strain (CARDIA)    Difficulty of Paying Living Expenses: Not hard at all  Food Insecurity: No Food Insecurity (03/11/2023)   Hunger Vital Sign    Worried About Programme researcher, broadcasting/film/video in  the Last Year: Never true    Ran Out of Food in the Last Year: Never true  Transportation Needs: No Transportation Needs (03/11/2023)   PRAPARE - Administrator, Civil Service (Medical): No    Lack of Transportation (Non-Medical): No  Physical Activity: Sufficiently Active (03/11/2023)   Exercise Vital Sign    Days of Exercise per Week: 4 days    Minutes of Exercise per Session: 50 min  Stress: No Stress Concern Present (03/11/2023)   [employer] of Occupational Health - Occupational Stress Questionnaire    Feeling of Stress : Not at all  Social Connections: Unknown (03/11/2023)   Social Connection and Isolation Panel [NHANES]    Frequency of Communication with Friends and Family: Once  a week    Frequency of Social Gatherings with Friends and Family: Once a week    Attends Religious Services: Patient declined    Database administrator or Organizations: No    Attends Engineer, structural: Not on file    Marital Status: Married  Intimate Partner Violence: Unknown (02/24/2022)   Received from [employer], [facility]   HITS    Physically Hurt: Not on file    Insult or Talk Down To: Not on file    Threaten Physical Harm: Not on file    Scream or Curse: Not on file    Outpatient Medications Prior to Visit  Medication Sig Dispense Refill   ASPIRIN 81 PO Take 81 mg by mouth daily.     hydrochlorothiazide (MICROZIDE) 12.5 MG capsule Take 1 capsule (12.5 mg total) by mouth daily. 90 capsule 1   rosuvastatin (CRESTOR) 5 MG tablet Take 1 tablet (5 mg total) by mouth daily. 90 tablet 1   verapamil (CALAN-SR) 240 MG CR tablet Take 1 tablet (240 mg total) by mouth daily. 90 tablet 1   No facility-administered medications prior to visit.    No Known Allergies  Review of Systems  Constitutional:  Negative for appetite change, chills, fatigue and fever.  HENT:  Negative for congestion, dental problem, ear pain and sore throat.   Eyes:  Negative for discharge, redness and visual disturbance.  Respiratory:  Negative for cough, chest tightness, shortness of breath and wheezing.   Cardiovascular:  Negative for chest pain, palpitations and leg swelling.  Gastrointestinal:  Negative for abdominal pain, blood in stool, diarrhea, nausea and vomiting.  Genitourinary:  Negative for difficulty urinating, dysuria, flank pain, frequency, hematuria and urgency.  Musculoskeletal:  Negative for arthralgias, back pain, joint swelling, myalgias and neck stiffness.  Skin:  Negative for pallor and rash.  Neurological:  Negative for dizziness, speech difficulty, weakness and headaches.  Hematological:  Negative for adenopathy. Does not bruise/bleed easily.  Psychiatric/Behavioral:   Negative for confusion and sleep disturbance. The patient is not nervous/anxious.     PE;    08/20/2023    9:58 AM 02/12/2023    9:42 AM 11/21/2022    2:33 PM  Vitals with BMI  Height 5\' 9"  5' 9.75"   Weight 168 lbs 10 oz 174 lbs   BMI 24.89 25.14   Systolic 129 133 528  Diastolic 76 72 64  Pulse 69 67    Gen: Alert, well appearing.  Patient is oriented to person, place, time, and situation. AFFECT: pleasant, lucid thought and speech. ENT: Ears: EACs clear, normal epithelium.  TMs with good light reflex and landmarks bilaterally.  Eyes: no injection, icteris, swelling, or exudate.  EOMI, PERRLA. Nose: no drainage  or turbinate edema/swelling.  No injection or focal lesion.  Mouth: lips without lesion/swelling.  Oral mucosa pink and moist.  Dentition intact and without obvious caries or gingival swelling.  Oropharynx without erythema, exudate, or swelling.  Neck: supple/nontender.  No LAD, mass, or TM.  Carotid pulses 2+ bilaterally, without bruits. CV: RRR, no m/r/g.   LUNGS: CTA bilat, nonlabored resps, good aeration in all lung fields. ABD: soft, NT, ND, BS normal.  No hepatospenomegaly or mass.  No bruits. EXT: no clubbing, cyanosis, or edema.  Musculoskeletal: no joint swelling, erythema, warmth, or tenderness.  ROM of all joints intact. Skin - no sores or suspicious lesions or rashes or color changes  Pertinent labs:  Lab Results  Component Value Date   TSH 3.14 02/19/2016   Lab Results  Component Value Date   WBC 5.9 07/17/2022   HGB 14.8 07/17/2022   HCT 44.4 07/17/2022   MCV 96.9 07/17/2022   PLT 233.0 07/17/2022   Lab Results  Component Value Date   CREATININE 1.00 02/12/2023   BUN 17 02/12/2023   NA 143 02/12/2023   K 4.3 02/12/2023   CL 104 02/12/2023   CO2 26 02/12/2023   Lab Results  Component Value Date   ALT 16 02/12/2023   AST 18 02/12/2023   ALKPHOS 65 07/17/2022   BILITOT 0.5 02/12/2023   Lab Results  Component Value Date   CHOL 166  02/12/2023   Lab Results  Component Value Date   HDL 68 02/12/2023   Lab Results  Component Value Date   LDLCALC 80 02/12/2023   Lab Results  Component Value Date   TRIG 96 02/12/2023   Lab Results  Component Value Date   CHOLHDL 2.4 02/12/2023   Lab Results  Component Value Date   PSA 1.64 05/01/2020   PSA 2.08 05/03/2019   PSA 2.23 03/09/2018   Lab Results  Component Value Date   HGBA1C 5.8 (H) 02/12/2023   ASSESSMENT AND PLAN:   1) Health maintenance exam: Reviewed age and gender appropriate health maintenance issues (prudent diet, regular exercise, health risks of tobacco and excessive alcohol, use of seatbelts, fire alarms in home, use of sunscreen).  Also reviewed age and gender appropriate health screening as well as vaccine recommendations. Vaccines: Shingrix->declined.  Flu->UTD.  Otherwise UTD. Labs: cbc, cmet, flp Prostate ca screening: no further prostate ca screening indicated. Colon ca screening: no further colon ca screening indicated.  2) HTN, well controlled on hydrochlorothiazide 12.5mg  every day. Lytes/cr today.  3) Hyperchol, doing well on crestor 5mg  every day. Lipids today.  An After Visit Summary was printed and given to the patient.  FOLLOW UP:  Return in about 6 months (around 02/17/2024) for routine chronic illness f/u.  Signed:  Santiago Bumpers, MD           08/20/2023

## 2023-08-21 LAB — CBC WITH DIFFERENTIAL/PLATELET
Absolute Monocytes: 464 {cells}/uL (ref 200–950)
Basophils Absolute: 43 {cells}/uL (ref 0–200)
Basophils Relative: 0.7 %
Eosinophils Absolute: 384 {cells}/uL (ref 15–500)
Eosinophils Relative: 6.3 %
HCT: 47.2 % (ref 38.5–50.0)
Hemoglobin: 15.9 g/dL (ref 13.2–17.1)
Lymphs Abs: 1336 {cells}/uL (ref 850–3900)
MCH: 32.5 pg (ref 27.0–33.0)
MCHC: 33.7 g/dL (ref 32.0–36.0)
MCV: 96.5 fL (ref 80.0–100.0)
MPV: 10.3 fL (ref 7.5–12.5)
Monocytes Relative: 7.6 %
Neutro Abs: 3874 {cells}/uL (ref 1500–7800)
Neutrophils Relative %: 63.5 %
Platelets: 258 10*3/uL (ref 140–400)
RBC: 4.89 10*6/uL (ref 4.20–5.80)
RDW: 13 % (ref 11.0–15.0)
Total Lymphocyte: 21.9 %
WBC: 6.1 10*3/uL (ref 3.8–10.8)

## 2023-08-21 LAB — HEMOGLOBIN A1C
Hgb A1c MFr Bld: 5.7 %{Hb} — ABNORMAL HIGH (ref <5.7)
Mean Plasma Glucose: 117 mg/dL
eAG (mmol/L): 6.5 mmol/L

## 2023-08-21 LAB — COMPREHENSIVE METABOLIC PANEL
AG Ratio: 1.6 (calc) (ref 1.0–2.5)
ALT: 12 U/L (ref 9–46)
AST: 15 U/L (ref 10–35)
Albumin: 4.1 g/dL (ref 3.6–5.1)
Alkaline phosphatase (APISO): 67 U/L (ref 35–144)
BUN: 19 mg/dL (ref 7–25)
CO2: 30 mmol/L (ref 20–32)
Calcium: 9.4 mg/dL (ref 8.6–10.3)
Chloride: 103 mmol/L (ref 98–110)
Creat: 0.95 mg/dL (ref 0.70–1.22)
Globulin: 2.6 g/dL (ref 1.9–3.7)
Glucose, Bld: 100 mg/dL — ABNORMAL HIGH (ref 65–99)
Potassium: 4.4 mmol/L (ref 3.5–5.3)
Sodium: 140 mmol/L (ref 135–146)
Total Bilirubin: 0.4 mg/dL (ref 0.2–1.2)
Total Protein: 6.7 g/dL (ref 6.1–8.1)

## 2023-08-21 LAB — LIPID PANEL
Cholesterol: 191 mg/dL (ref <200)
HDL: 68 mg/dL (ref >=40)
LDL Cholesterol (Calc): 104 mg/dL — ABNORMAL HIGH
Non-HDL Cholesterol (Calc): 123 mg/dL (ref <130)
Total CHOL/HDL Ratio: 2.8 (calc) (ref <5.0)
Triglycerides: 94 mg/dL (ref <150)

## 2023-08-27 DIAGNOSIS — D225 Melanocytic nevi of trunk: Secondary | ICD-10-CM | POA: Diagnosis not present

## 2023-08-27 DIAGNOSIS — L814 Other melanin hyperpigmentation: Secondary | ICD-10-CM | POA: Diagnosis not present

## 2023-08-27 DIAGNOSIS — Z08 Encounter for follow-up examination after completed treatment for malignant neoplasm: Secondary | ICD-10-CM | POA: Diagnosis not present

## 2023-08-27 DIAGNOSIS — L570 Actinic keratosis: Secondary | ICD-10-CM | POA: Diagnosis not present

## 2023-08-27 DIAGNOSIS — L821 Other seborrheic keratosis: Secondary | ICD-10-CM | POA: Diagnosis not present

## 2023-08-27 DIAGNOSIS — Z85828 Personal history of other malignant neoplasm of skin: Secondary | ICD-10-CM | POA: Diagnosis not present

## 2023-10-14 DIAGNOSIS — M25562 Pain in left knee: Secondary | ICD-10-CM | POA: Diagnosis not present

## 2023-12-30 DIAGNOSIS — H524 Presbyopia: Secondary | ICD-10-CM | POA: Diagnosis not present

## 2023-12-30 DIAGNOSIS — H5202 Hypermetropia, left eye: Secondary | ICD-10-CM | POA: Diagnosis not present

## 2023-12-30 DIAGNOSIS — H02839 Dermatochalasis of unspecified eye, unspecified eyelid: Secondary | ICD-10-CM | POA: Diagnosis not present

## 2023-12-30 DIAGNOSIS — H401131 Primary open-angle glaucoma, bilateral, mild stage: Secondary | ICD-10-CM | POA: Diagnosis not present

## 2023-12-30 DIAGNOSIS — H52223 Regular astigmatism, bilateral: Secondary | ICD-10-CM | POA: Diagnosis not present

## 2023-12-30 DIAGNOSIS — H5211 Myopia, right eye: Secondary | ICD-10-CM | POA: Diagnosis not present

## 2023-12-30 DIAGNOSIS — Z961 Presence of intraocular lens: Secondary | ICD-10-CM | POA: Diagnosis not present

## 2024-01-12 ENCOUNTER — Other Ambulatory Visit: Payer: Self-pay | Admitting: Family Medicine

## 2024-01-30 ENCOUNTER — Other Ambulatory Visit: Payer: Self-pay | Admitting: Family Medicine

## 2024-02-08 ENCOUNTER — Other Ambulatory Visit: Payer: Self-pay | Admitting: Family Medicine

## 2024-02-18 ENCOUNTER — Ambulatory Visit: Payer: Medicare HMO | Admitting: Family Medicine

## 2024-02-25 DIAGNOSIS — L821 Other seborrheic keratosis: Secondary | ICD-10-CM | POA: Diagnosis not present

## 2024-02-25 DIAGNOSIS — Z85828 Personal history of other malignant neoplasm of skin: Secondary | ICD-10-CM | POA: Diagnosis not present

## 2024-02-25 DIAGNOSIS — Z08 Encounter for follow-up examination after completed treatment for malignant neoplasm: Secondary | ICD-10-CM | POA: Diagnosis not present

## 2024-02-25 DIAGNOSIS — L814 Other melanin hyperpigmentation: Secondary | ICD-10-CM | POA: Diagnosis not present

## 2024-02-25 DIAGNOSIS — L570 Actinic keratosis: Secondary | ICD-10-CM | POA: Diagnosis not present

## 2024-02-25 DIAGNOSIS — D225 Melanocytic nevi of trunk: Secondary | ICD-10-CM | POA: Diagnosis not present

## 2024-03-17 ENCOUNTER — Encounter: Payer: Self-pay | Admitting: Family Medicine

## 2024-03-17 ENCOUNTER — Ambulatory Visit (INDEPENDENT_AMBULATORY_CARE_PROVIDER_SITE_OTHER): Admitting: Family Medicine

## 2024-03-17 VITALS — BP 136/65 | HR 71 | Temp 97.6°F | Ht 69.0 in | Wt 171.2 lb

## 2024-03-17 DIAGNOSIS — E7800 Pure hypercholesterolemia, unspecified: Secondary | ICD-10-CM | POA: Diagnosis not present

## 2024-03-17 DIAGNOSIS — I10 Essential (primary) hypertension: Secondary | ICD-10-CM | POA: Diagnosis not present

## 2024-03-17 MED ORDER — ROSUVASTATIN CALCIUM 5 MG PO TABS: 5.0000 mg | ORAL_TABLET | Freq: Every day | ORAL | 3 refills | Status: AC

## 2024-03-17 MED ORDER — HYDROCHLOROTHIAZIDE 12.5 MG PO CAPS: 12.5000 mg | ORAL_CAPSULE | Freq: Every day | ORAL | 3 refills | Status: AC

## 2024-03-17 MED ORDER — VERAPAMIL HCL ER 240 MG PO TBCR: 240.0000 mg | EXTENDED_RELEASE_TABLET | Freq: Every day | ORAL | 3 refills | Status: AC

## 2024-03-17 NOTE — Progress Notes (Signed)
 OFFICE VISIT  03/17/2024  CC:  Chief Complaint  Patient presents with   Medical Management of Chronic Issues    Pt is fasting    Patient is a 83 y.o. male who presents for 73-month follow-up hypertension and hyperlipidemia. A/P as of last visit: "1) HTN, well controlled on hydrochlorothiazide  12.5mg  every day. Lytes/cr today.   2) Hyperchol, doing well on crestor  5mg  every day. Lipids today."  INTERIM HX: Curtis Matthews feels well. Home blood pressures ranging from 120s to low 130s systolic.  Diastolic is always in the 70s. He is pretty active, tries to eat a low-sodium diet. No acute concerns today.  Past Medical History:  Diagnosis Date   Colon polyps    Repeat TCS on 09/04/2014 showed no polyps and GI MD recommended NO FURTHER COLONOSCOPIES   Hyperkalemia    ? secondary to ACE-I?  Dose decreased by 50% 02/22/16 and repeat K was in normal range.    Hyperlipidemia    Hypertension    IFG (impaired fasting glucose) 11/01/2019   gluc 110. HbAc 5.5%. A1c 5.7% 11/2020.   Increased prostate specific antigen (PSA) velocity    2018: on recheck 6 mo later PSA returned to his baseline (<2.0).  Repeat 02/2018 stable.     Nonmelanoma skin cancer 2009   sees skin MD annually   Sensorineural hearing loss (SNHL) of both ears 2018   [facility] [place]--hearing aids.   Tinnitus aurium, left    + chronic bilat hearing loss    Past Surgical History:  Procedure Laterality Date   CATARACT EXTRACTION Left 10/06/2022   CATARACT EXTRACTION Right 10/20/2022   COLONOSCOPY  10/12/12015   mild diverticulosis.  No polyps.  No further screening colonoscopies recommended ([facility])   COLONOSCOPY W/ POLYPECTOMY  06/2009   adenomatous polyps; recall 06/2014 ([facility] [place]   INGUINAL HERNIA REPAIR Left 2001   TONSILLECTOMY     US  CAROTID DOPPLER BILATERAL ([facility] HX)  10/26/2019   No signif stenosis on either side.  vert's patent with antegrade flow    Outpatient  Medications Prior to Visit  Medication Sig Dispense Refill   ASPIRIN 81 PO Take 81 mg by mouth daily.     hydrochlorothiazide  (MICROZIDE ) 12.5 MG capsule TAKE 1 CAPSULE DAILY 90 capsule 0   rosuvastatin  (CRESTOR ) 5 MG tablet TAKE 1 TABLET DAILY 30 tablet 0   verapamil  (CALAN -SR) 240 MG CR tablet TAKE 1 TABLET DAILY 90 tablet 0   No facility-administered medications prior to visit.    No Known Allergies  Review of Systems As per HPI  PE:    03/17/2024   10:15 AM 08/20/2023    9:58 AM 02/12/2023    9:42 AM  Vitals with BMI  Height 5\' 9"  5\' 9"  5' 9.75"  Weight 171 lbs 3 oz 168 lbs 10 oz 174 lbs  BMI 25.27 24.89 25.14  Systolic 136 129 621  Diastolic 65 76 72  Pulse 71 69 67     Physical Exam  Gen: Alert, well appearing.  Patient is oriented to person, place, time, and situation. AFFECT: pleasant, lucid thought and speech. CV: RRR, no m/r/g.   LUNGS: CTA bilat, nonlabored resps, good aeration in all lung fields. EXT: no clubbing or cyanosis.  no edema.    LABS:  Last CBC Lab Results  Component Value Date   WBC 6.1 08/20/2023   HGB 15.9 08/20/2023   HCT 47.2 08/20/2023   MCV 96.5 08/20/2023   MCH 32.5 08/20/2023  RDW 13.0 08/20/2023   PLT 258 08/20/2023   Last metabolic panel Lab Results  Component Value Date   GLUCOSE 100 (H) 08/20/2023   NA 140 08/20/2023   K 4.4 08/20/2023   CL 103 08/20/2023   CO2 30 08/20/2023   BUN 19 08/20/2023   CREATININE 0.95 08/20/2023   GFR 68.23 07/17/2022   CALCIUM  9.4 08/20/2023   PROT 6.7 08/20/2023   ALBUMIN 4.2 07/17/2022   BILITOT 0.4 08/20/2023   ALKPHOS 65 07/17/2022   AST 15 08/20/2023   ALT 12 08/20/2023   Last lipids Lab Results  Component Value Date   CHOL 191 08/20/2023   HDL 68 08/20/2023   LDLCALC 104 (H) 08/20/2023   TRIG 94 08/20/2023   CHOLHDL 2.8 08/20/2023   Last hemoglobin A1c Lab Results  Component Value Date   HGBA1C 5.7 (H) 08/20/2023   IMPRESSION AND PLAN:  1) HTN, well controlled on  hydrochlorothiazide  12.5mg  every day. Lytes/cr today.   2) Hyperchol, doing well on crestor  5mg  every day. His LDL has hovered around 100.  Unless LDL goes up significantly he prefers to keep his dose of rosuvastatin  at 5 mg a day and work on his diet. Lipids today.  An After Visit Summary was printed and given to the patient.  FOLLOW UP: Return in about 6 months (around 09/16/2024) for annual CPE (fasting).  Signed:  Arletha Lady, MD           03/17/2024

## 2024-03-18 ENCOUNTER — Other Ambulatory Visit: Payer: Self-pay | Admitting: Family Medicine

## 2024-03-18 ENCOUNTER — Encounter: Payer: Self-pay | Admitting: Family Medicine

## 2024-03-18 DIAGNOSIS — R7989 Other specified abnormal findings of blood chemistry: Secondary | ICD-10-CM

## 2024-03-18 LAB — BASIC METABOLIC PANEL WITH GFR
BUN: 18 mg/dL (ref 7–25)
CO2: 28 mmol/L (ref 20–32)
Calcium: 9.6 mg/dL (ref 8.6–10.3)
Chloride: 103 mmol/L (ref 98–110)
Creat: 1.21 mg/dL (ref 0.70–1.22)
Glucose, Bld: 109 mg/dL — ABNORMAL HIGH (ref 65–99)
Potassium: 4.7 mmol/L (ref 3.5–5.3)
Sodium: 141 mmol/L (ref 135–146)
eGFR: 59 mL/min/{1.73_m2} — ABNORMAL LOW (ref >=60)

## 2024-03-18 LAB — LIPID PANEL
Cholesterol: 184 mg/dL (ref <200)
HDL: 70 mg/dL (ref >=40)
LDL Cholesterol (Calc): 97 mg/dL
Non-HDL Cholesterol (Calc): 114 mg/dL (ref <130)
Total CHOL/HDL Ratio: 2.6 (calc) (ref <5.0)
Triglycerides: 82 mg/dL (ref <150)

## 2024-03-23 ENCOUNTER — Other Ambulatory Visit

## 2024-03-30 ENCOUNTER — Other Ambulatory Visit

## 2024-03-30 DIAGNOSIS — R7989 Other specified abnormal findings of blood chemistry: Secondary | ICD-10-CM

## 2024-03-31 ENCOUNTER — Encounter: Payer: Self-pay | Admitting: Family Medicine

## 2024-03-31 LAB — BASIC METABOLIC PANEL WITH GFR
BUN: 16 mg/dL (ref 7–25)
CO2: 26 mmol/L (ref 20–32)
Calcium: 9.7 mg/dL (ref 8.6–10.3)
Chloride: 101 mmol/L (ref 98–110)
Creat: 1.09 mg/dL (ref 0.70–1.22)
Glucose, Bld: 104 mg/dL — ABNORMAL HIGH (ref 65–99)
Potassium: 4.8 mmol/L (ref 3.5–5.3)
Sodium: 137 mmol/L (ref 135–146)
eGFR: 67 mL/min/{1.73_m2} (ref >=60)

## 2024-04-22 ENCOUNTER — Encounter: Payer: Self-pay | Admitting: Pharmacist

## 2024-04-22 NOTE — Progress Notes (Signed)
 Pharmacy Quality Measure Review  This patient is appearing on a report for being at risk of failing the adherence measure for cholesterol (statin) medications this calendar year.   Medication: rosuvastatin   Last fill date: 02/01/2024 for 30 day supply  Reviewed chart and Dr Anson Basta database. Patient has filled 90 day supply of rosuvastatin  on 03/17/2024 - has 3 refills remaining  Insurance report was not up to date. No action needed at this time.   Cecilie Coffee, PharmD Clinical Pharmacist [facility]  Population Health [phone]

## 2024-07-14 DIAGNOSIS — H52223 Regular astigmatism, bilateral: Secondary | ICD-10-CM | POA: Diagnosis not present

## 2024-07-14 DIAGNOSIS — H401131 Primary open-angle glaucoma, bilateral, mild stage: Secondary | ICD-10-CM | POA: Diagnosis not present

## 2024-07-14 DIAGNOSIS — H5211 Myopia, right eye: Secondary | ICD-10-CM | POA: Diagnosis not present

## 2024-07-14 DIAGNOSIS — H02839 Dermatochalasis of unspecified eye, unspecified eyelid: Secondary | ICD-10-CM | POA: Diagnosis not present

## 2024-07-14 DIAGNOSIS — H5202 Hypermetropia, left eye: Secondary | ICD-10-CM | POA: Diagnosis not present

## 2024-07-14 DIAGNOSIS — Z961 Presence of intraocular lens: Secondary | ICD-10-CM | POA: Diagnosis not present

## 2024-07-14 DIAGNOSIS — H524 Presbyopia: Secondary | ICD-10-CM | POA: Diagnosis not present

## 2024-07-19 DIAGNOSIS — L570 Actinic keratosis: Secondary | ICD-10-CM | POA: Diagnosis not present

## 2024-07-19 DIAGNOSIS — L853 Xerosis cutis: Secondary | ICD-10-CM | POA: Diagnosis not present

## 2024-07-19 DIAGNOSIS — L820 Inflamed seborrheic keratosis: Secondary | ICD-10-CM | POA: Diagnosis not present

## 2024-09-08 DIAGNOSIS — L821 Other seborrheic keratosis: Secondary | ICD-10-CM | POA: Diagnosis not present

## 2024-09-08 DIAGNOSIS — L820 Inflamed seborrheic keratosis: Secondary | ICD-10-CM | POA: Diagnosis not present

## 2024-09-08 DIAGNOSIS — Z85828 Personal history of other malignant neoplasm of skin: Secondary | ICD-10-CM | POA: Diagnosis not present

## 2024-09-08 DIAGNOSIS — L814 Other melanin hyperpigmentation: Secondary | ICD-10-CM | POA: Diagnosis not present

## 2024-09-08 DIAGNOSIS — Z08 Encounter for follow-up examination after completed treatment for malignant neoplasm: Secondary | ICD-10-CM | POA: Diagnosis not present

## 2024-09-08 DIAGNOSIS — D1801 Hemangioma of skin and subcutaneous tissue: Secondary | ICD-10-CM | POA: Diagnosis not present

## 2024-09-08 DIAGNOSIS — R208 Other disturbances of skin sensation: Secondary | ICD-10-CM | POA: Diagnosis not present

## 2024-09-15 ENCOUNTER — Encounter: Payer: Self-pay | Admitting: Family Medicine

## 2024-09-15 ENCOUNTER — Ambulatory Visit (INDEPENDENT_AMBULATORY_CARE_PROVIDER_SITE_OTHER): Admitting: Family Medicine

## 2024-09-15 VITALS — BP 143/76 | HR 68 | Temp 97.9°F | Ht 69.5 in | Wt 164.6 lb

## 2024-09-15 DIAGNOSIS — Z23 Encounter for immunization: Secondary | ICD-10-CM | POA: Diagnosis not present

## 2024-09-15 DIAGNOSIS — E7800 Pure hypercholesterolemia, unspecified: Secondary | ICD-10-CM

## 2024-09-15 DIAGNOSIS — R7301 Impaired fasting glucose: Secondary | ICD-10-CM

## 2024-09-15 DIAGNOSIS — Z0000 Encounter for general adult medical examination without abnormal findings: Secondary | ICD-10-CM

## 2024-09-15 DIAGNOSIS — I10 Essential (primary) hypertension: Secondary | ICD-10-CM | POA: Diagnosis not present

## 2024-09-15 NOTE — Progress Notes (Signed)
 Office Note 09/15/2024  CC:  Chief Complaint  Patient presents with   Annual Exam   Patient is a 83 y.o. male who is here for annual health maintenance exam and 68-month follow-up hypertension and hyperlipidemia. A/P as of last visit: 1) HTN, well controlled on hydrochlorothiazide  12.5mg  every day. Lytes/cr today.   2) Hyperchol, doing well on crestor  5mg  every day. His LDL has hovered around 100.  Unless LDL goes up significantly he prefers to keep his dose of rosuvastatin  at 5 mg a day and work on his diet. Lipids today.  INTERIM HX: Curtis Matthews feels well. He has been eating a heart/kidney healthy diet over the last 6 months and has lost 5 pounds.  He plays golf 1 day a week and goes to the gym a couple of days a week.  He pushes wheelchairs at the [place] 1 afternoon a week.  He has no acute concerns.    Past Medical History:  Diagnosis Date   Colon polyps    Repeat TCS on 09/04/2014 showed no polyps and GI MD recommended NO FURTHER COLONOSCOPIES   Hyperkalemia    ? secondary to ACE-I?  Dose decreased by 50% 02/22/16 and repeat K was in normal range.    Hyperlipidemia    Hypertension    IFG (impaired fasting glucose) 11/01/2019   gluc 110. HbAc 5.5%. A1c 5.7% 11/2020.   Increased prostate specific antigen (PSA) velocity    2018: on recheck 6 mo later PSA returned to his baseline (<2.0).  Repeat 02/2018 stable.     Nonmelanoma skin cancer 2009   sees skin MD annually   Sensorineural hearing loss (SNHL) of both ears 2018   [facility] [place]--hearing aids.   Tinnitus aurium, left    + chronic bilat hearing loss    Past Surgical History:  Procedure Laterality Date   CATARACT EXTRACTION Left 10/06/2022   CATARACT EXTRACTION Right 10/20/2022   COLONOSCOPY  10/12/12015   mild diverticulosis.  No polyps.  No further screening colonoscopies recommended ([facility])   COLONOSCOPY W/ POLYPECTOMY  06/2009   adenomatous polyps; recall 06/2014 [facility] [place]   INGUINAL HERNIA REPAIR Left 2001   TONSILLECTOMY     US  CAROTID DOPPLER BILATERAL ([facility] HX)  10/26/2019   No signif stenosis on either side.  vert's patent with antegrade flow    Family History  Problem Relation Age of Onset   Heart disease Mother    Hypertension Mother     Social History   Socioeconomic History   Marital status: Married    Spouse name: Not on file   Number of children: Not on file   Years of education: Not on file   Highest education level: 12th grade  Occupational History   Not on file  Tobacco Use   Smoking status: Never   Smokeless tobacco: Never  Substance and Sexual Activity   Alcohol use: Yes    Alcohol/week: 1.0 standard drink of alcohol    Types: 1 Glasses of wine per week    Comment: glass of wine daily   Drug use: No   Sexual activity: Not on file  Other Topics Concern   Not on file  Social History Narrative   Married, has one adopted son.   Occupation:  Retired Psychologist, clinical for [employer].   Never smoker, alcohol--1-2 glasses of wine per day.     No hx of alc abuse or drug use.   Social Drivers of Health  Financial Resource Strain: Low Risk  (09/11/2024)   Overall Financial Resource Strain (CARDIA)    Difficulty of Paying Living Expenses: Not hard at all  Food Insecurity: No Food Insecurity (09/11/2024)   Hunger Vital Sign    Worried About Running Out of Food in the Last Year: Never true    Ran Out of Food in the Last Year: Never true  Transportation Needs: No Transportation Needs (09/11/2024)   PRAPARE - Administrator, Civil Service (Medical): No    Lack of Transportation (Non-Medical): No  Physical Activity: Sufficiently Active (09/11/2024)   Exercise Vital Sign    Days of Exercise per Week: 4 days    Minutes of Exercise per Session: 40 min  Stress: No Stress Concern Present (09/11/2024)   [employer] of Occupational Health - Occupational Stress Questionnaire    Feeling of  Stress: Only a little  Social Connections: Moderately Isolated (09/11/2024)   Social Connection and Isolation Panel    Frequency of Communication with Friends and Family: Once a week    Frequency of Social Gatherings with Friends and Family: Twice a week    Attends Religious Services: Never    Database administrator or Organizations: No    Attends Engineer, structural: Not on file    Marital Status: Married  Intimate Partner Violence: Unknown (02/24/2022)   Received from [facility]   HITS    Physically Hurt: Not on file    Insult or Talk Down To: Not on file    Threaten Physical Harm: Not on file    Scream or Curse: Not on file    Outpatient Medications Prior to Visit  Medication Sig Dispense Refill   ASPIRIN 81 PO Take 81 mg by mouth daily.     hydrochlorothiazide  (MICROZIDE ) 12.5 MG capsule Take 1 capsule (12.5 mg total) by mouth daily. 90 capsule 3   rosuvastatin  (CRESTOR ) 5 MG tablet Take 1 tablet (5 mg total) by mouth daily. 90 tablet 3   verapamil  (CALAN -SR) 240 MG CR tablet Take 1 tablet (240 mg total) by mouth daily. 90 tablet 3   No facility-administered medications prior to visit.    No Known Allergies  Review of Systems  Constitutional:  Negative for appetite change, chills, fatigue and fever.  HENT:  Negative for congestion, dental problem, ear pain and sore throat.   Eyes:  Negative for discharge, redness and visual disturbance.  Respiratory:  Negative for cough, chest tightness, shortness of breath and wheezing.   Cardiovascular:  Negative for chest pain, palpitations and leg swelling.  Gastrointestinal:  Negative for abdominal pain, blood in stool, diarrhea, nausea and vomiting.  Genitourinary:  Negative for difficulty urinating, dysuria, flank pain, frequency, hematuria and urgency.  Musculoskeletal:  Negative for arthralgias, back pain, joint swelling, myalgias and neck stiffness.  Skin:  Negative for pallor and rash.  Neurological:  Negative for  dizziness, speech difficulty, weakness and headaches.  Hematological:  Negative for adenopathy. Does not bruise/bleed easily.  Psychiatric/Behavioral:  Negative for confusion and sleep disturbance. The patient is not nervous/anxious.     PE;    09/15/2024    9:37 AM 09/15/2024    9:23 AM 03/17/2024   10:15 AM  Vitals with BMI  Height  5' 9.5 5' 9  Weight  164 lbs 10 oz 171 lbs 3 oz  BMI  23.97 25.27  Systolic 143 144 863  Diastolic 76 74 65  Pulse  68 71   Gen: Alert, well appearing.  Patient is oriented to person, place, time, and situation. AFFECT: pleasant, lucid thought and speech. ENT: Ears: EACs clear, normal epithelium.  TMs with good light reflex and landmarks bilaterally.  Eyes: no injection, icteris, swelling, or exudate.  EOMI, PERRLA. Nose: no drainage or turbinate edema/swelling.  No injection or focal lesion.  Mouth: lips without lesion/swelling.  Oral mucosa pink and moist.  Dentition intact and without obvious caries or gingival swelling.  Oropharynx without erythema, exudate, or swelling.  Neck: supple/nontender.  No LAD, mass, or TM.  Carotid pulses 2+ bilaterally, without bruits. CV: RRR, no m/r/g.   LUNGS: CTA bilat, nonlabored resps, good aeration in all lung fields. ABD: soft, NT, ND, BS normal.  No hepatospenomegaly or mass.  No bruits. EXT: no clubbing, cyanosis, or edema.  Musculoskeletal: no joint swelling, erythema, warmth, or tenderness.  ROM of all joints intact. Skin - no sores or suspicious lesions or rashes or color changes  Pertinent labs:  Lab Results  Component Value Date   TSH 3.14 02/19/2016   Lab Results  Component Value Date   WBC 6.1 08/20/2023   HGB 15.9 08/20/2023   HCT 47.2 08/20/2023   MCV 96.5 08/20/2023   PLT 258 08/20/2023   Lab Results  Component Value Date   CREATININE 1.09 03/30/2024   BUN 16 03/30/2024   NA 137 03/30/2024   K 4.8 03/30/2024   CL 101 03/30/2024   CO2 26 03/30/2024   Lab Results  Component Value  Date   ALT 12 08/20/2023   AST 15 08/20/2023   ALKPHOS 65 07/17/2022   BILITOT 0.4 08/20/2023   Lab Results  Component Value Date   CHOL 184 03/17/2024   Lab Results  Component Value Date   HDL 70 03/17/2024   Lab Results  Component Value Date   LDLCALC 97 03/17/2024   Lab Results  Component Value Date   TRIG 82 03/17/2024   Lab Results  Component Value Date   CHOLHDL 2.6 03/17/2024   Lab Results  Component Value Date   PSA 1.64 05/01/2020   PSA 2.08 05/03/2019   PSA 2.23 03/09/2018   Lab Results  Component Value Date   HGBA1C 5.7 (H) 08/20/2023   ASSESSMENT AND PLAN:   1) Health maintenance exam: Reviewed age and gender appropriate health maintenance issues (prudent diet, regular exercise, health risks of tobacco and excessive alcohol, use of seatbelts, fire alarms in home, use of sunscreen).  Also reviewed age and gender appropriate health screening as well as vaccine recommendations. Vaccines:  Flu->given today .  Otherwise UTD. Labs: cbc, cmet, flp Prostate ca screening: no further prostate ca screening indicated. Colon ca screening: no further colon ca screening indicated.  2)  HTN, well controlled on hydrochlorothiazide  12.5mg  every day. Lytes/cr today.   3) Hyperchol, doing well on crestor  5mg  every day. His LDL has hovered around 100.  Unless LDL goes up significantly he prefers to keep his dose of rosuvastatin  at 5 mg a day and work on his diet. Lipids today.  An After Visit Summary was printed and given to the patient.  FOLLOW UP:  Return in about 6 months (around 03/16/2025) for routine chronic illness f/u.  Signed:  Gerlene Hockey, MD           09/15/2024

## 2024-09-15 NOTE — Patient Instructions (Signed)
 Health Maintenance, Male  Adopting a healthy lifestyle and getting preventive care are important in promoting health and wellness. Ask your health care provider about:  The right schedule for you to have regular tests and exams.  Things you can do on your own to prevent diseases and keep yourself healthy.  What should I know about diet, weight, and exercise?  Eat a healthy diet    Eat a diet that includes plenty of vegetables, fruits, low-fat dairy products, and lean protein.  Do not eat a lot of foods that are high in solid fats, added sugars, or sodium.  Maintain a healthy weight  Body mass index (BMI) is a measurement that can be used to identify possible weight problems. It estimates body fat based on height and weight. Your health care provider can help determine your BMI and help you achieve or maintain a healthy weight.  Get regular exercise  Get regular exercise. This is one of the most important things you can do for your health. Most adults should:  Exercise for at least 150 minutes each week. The exercise should increase your heart rate and make you sweat (moderate-intensity exercise).  Do strengthening exercises at least twice a week. This is in addition to the moderate-intensity exercise.  Spend less time sitting. Even light physical activity can be beneficial.  Watch cholesterol and blood lipids  Have your blood tested for lipids and cholesterol at 85 years of age, then have this test every 5 years.  You may need to have your cholesterol levels checked more often if:  Your lipid or cholesterol levels are high.  You are older than 85 years of age.  You are at high risk for heart disease.  What should I know about cancer screening?  Many types of cancers can be detected early and may often be prevented. Depending on your health history and family history, you may need to have cancer screening at various ages. This may include screening for:  Colorectal cancer.  Prostate cancer.  Skin cancer.  Lung  cancer.  What should I know about heart disease, diabetes, and high blood pressure?  Blood pressure and heart disease  High blood pressure causes heart disease and increases the risk of stroke. This is more likely to develop in people who have high blood pressure readings or are overweight.  Talk with your health care provider about your target blood pressure readings.  Have your blood pressure checked:  Every 3-5 years if you are 24-52 years of age.  Every year if you are 3 years old or older.  If you are between the ages of 60 and 72 and are a current or former smoker, ask your health care provider if you should have a one-time screening for abdominal aortic aneurysm (AAA).  Diabetes  Have regular diabetes screenings. This checks your fasting blood sugar level. Have the screening done:  Once every three years after age 66 if you are at a normal weight and have a low risk for diabetes.  More often and at a younger age if you are overweight or have a high risk for diabetes.  What should I know about preventing infection?  Hepatitis B  If you have a higher risk for hepatitis B, you should be screened for this virus. Talk with your health care provider to find out if you are at risk for hepatitis B infection.  Hepatitis C  Blood testing is recommended for:  Everyone born from [birth year] through 1967.  Anyone  with known risk factors for hepatitis C.  Sexually transmitted infections (STIs)  You should be screened each year for STIs, including gonorrhea and chlamydia, if:  You are sexually active and are younger than 85 years of age.  You are older than 85 years of age and your health care provider tells you that you are at risk for this type of infection.  Your sexual activity has changed since you were last screened, and you are at increased risk for chlamydia or gonorrhea. Ask your health care provider if you are at risk.  Ask your health care provider about whether you are at high risk for HIV. Your health care provider  may recommend a prescription medicine to help prevent HIV infection. If you choose to take medicine to prevent HIV, you should first get tested for HIV. You should then be tested every 3 months for as long as you are taking the medicine.  Follow these instructions at home:  Alcohol use  Do not drink alcohol if your health care provider tells you not to drink.  If you drink alcohol:  Limit how much you have to 0-2 drinks a day.  Know how much alcohol is in your drink. In the [place] one drink equals one 12 oz bottle of beer (355 mL), one 5 oz glass of wine (148 mL), or one 1 oz glass of hard liquor (44 mL).  Lifestyle  Do not use any products that contain nicotine or tobacco. These products include cigarettes, chewing tobacco, and vaping devices, such as e-cigarettes. If you need help quitting, ask your health care provider.  Do not use street drugs.  Do not share needles.  Ask your health care provider for help if you need support or information about quitting drugs.  General instructions  Schedule regular health, dental, and eye exams.  Stay current with your vaccines.  Tell your health care provider if:  You often feel depressed.  You have ever been abused or do not feel safe at home.  Summary  Adopting a healthy lifestyle and getting preventive care are important in promoting health and wellness.  Follow your health care provider's instructions about healthy diet, exercising, and getting tested or screened for diseases.  Follow your health care provider's instructions on monitoring your cholesterol and blood pressure.  This information is not intended to replace advice given to you by your health care provider. Make sure you discuss any questions you have with your health care provider.  Document Revised: 04/01/2021 Document Reviewed: 04/01/2021  Elsevier Patient Education  2024 [employer].

## 2024-09-16 ENCOUNTER — Ambulatory Visit: Payer: Self-pay | Admitting: Family Medicine

## 2024-09-16 LAB — CBC WITH DIFFERENTIAL/PLATELET
Absolute Lymphocytes: 1732 {cells}/uL (ref 850–3900)
Absolute Monocytes: 635 {cells}/uL (ref 200–950)
Basophils Absolute: 48 {cells}/uL (ref 0–200)
Basophils Relative: 0.7 %
Eosinophils Absolute: 455 {cells}/uL (ref 15–500)
Eosinophils Relative: 6.6 %
HCT: 46.7 % (ref 38.5–50.0)
Hemoglobin: 15.5 g/dL (ref 13.2–17.1)
MCH: 31.7 pg (ref 27.0–33.0)
MCHC: 33.2 g/dL (ref 32.0–36.0)
MCV: 95.5 fL (ref 80.0–100.0)
MPV: 10.5 fL (ref 7.5–12.5)
Monocytes Relative: 9.2 %
Neutro Abs: 4030 {cells}/uL (ref 1500–7800)
Neutrophils Relative %: 58.4 %
Platelets: 272 Thousand/uL (ref 140–400)
RBC: 4.89 Million/uL (ref 4.20–5.80)
RDW: 12.8 % (ref 11.0–15.0)
Total Lymphocyte: 25.1 %
WBC: 6.9 Thousand/uL (ref 3.8–10.8)

## 2024-09-16 LAB — LIPID PANEL
Cholesterol: 190 mg/dL (ref <200)
HDL: 67 mg/dL (ref >=40)
LDL Cholesterol (Calc): 102 mg/dL — ABNORMAL HIGH
Non-HDL Cholesterol (Calc): 123 mg/dL (ref <130)
Total CHOL/HDL Ratio: 2.8 (calc) (ref <5.0)
Triglycerides: 117 mg/dL (ref <150)

## 2024-09-16 LAB — COMPREHENSIVE METABOLIC PANEL WITH GFR
AG Ratio: 1.6 (calc) (ref 1.0–2.5)
ALT: 13 U/L (ref 9–46)
AST: 16 U/L (ref 10–35)
Albumin: 4.2 g/dL (ref 3.6–5.1)
Alkaline phosphatase (APISO): 64 U/L (ref 35–144)
BUN: 21 mg/dL (ref 7–25)
CO2: 30 mmol/L (ref 20–32)
Calcium: 9.5 mg/dL (ref 8.6–10.3)
Chloride: 103 mmol/L (ref 98–110)
Creat: 1.15 mg/dL (ref 0.70–1.22)
Globulin: 2.6 g/dL (ref 1.9–3.7)
Glucose, Bld: 103 mg/dL — ABNORMAL HIGH (ref 65–99)
Potassium: 4.7 mmol/L (ref 3.5–5.3)
Sodium: 141 mmol/L (ref 135–146)
Total Bilirubin: 0.6 mg/dL (ref 0.2–1.2)
Total Protein: 6.8 g/dL (ref 6.1–8.1)
eGFR: 62 mL/min/1.73m2 (ref >=60)

## 2024-09-16 LAB — HEMOGLOBIN A1C
Hgb A1c MFr Bld: 5.6 % (ref <5.7)
Mean Plasma Glucose: 114 mg/dL
eAG (mmol/L): 6.3 mmol/L

## 2024-10-13 DIAGNOSIS — H524 Presbyopia: Secondary | ICD-10-CM | POA: Diagnosis not present

## 2024-10-13 DIAGNOSIS — H52223 Regular astigmatism, bilateral: Secondary | ICD-10-CM | POA: Diagnosis not present

## 2024-10-13 DIAGNOSIS — H02839 Dermatochalasis of unspecified eye, unspecified eyelid: Secondary | ICD-10-CM | POA: Diagnosis not present

## 2024-10-13 DIAGNOSIS — H5201 Hypermetropia, right eye: Secondary | ICD-10-CM | POA: Diagnosis not present

## 2024-10-13 DIAGNOSIS — Z961 Presence of intraocular lens: Secondary | ICD-10-CM | POA: Diagnosis not present

## 2024-10-13 DIAGNOSIS — H401131 Primary open-angle glaucoma, bilateral, mild stage: Secondary | ICD-10-CM | POA: Diagnosis not present

## 2024-11-04 NOTE — Progress Notes (Signed)
 Curtis Matthews                                          MRN: [id number]   11/04/2024   The [facility] Quality Team Specialist reviewed this patient medical record for the purposes of chart review for care gap closure. The following were reviewed: chart review for care gap closure-controlling blood pressure.    [facility] Quality Team

## 2024-12-20 NOTE — Progress Notes (Signed)
 Curtis Matthews                                          MRN: [id number]   12/20/2024   The [facility] Quality Team Specialist reviewed this patient medical record for the purposes of chart review for care gap closure. The following were reviewed: chart review for care gap closure-controlling blood pressure.    [facility] Quality Team

## 2024-12-27 ENCOUNTER — Ambulatory Visit: Admitting: Family Medicine

## 2024-12-27 ENCOUNTER — Encounter: Payer: Self-pay | Admitting: Family Medicine

## 2024-12-27 VITALS — BP 147/67 | HR 71 | Temp 97.2°F | Ht 69.5 in | Wt 168.0 lb

## 2024-12-27 DIAGNOSIS — L42 Pityriasis rosea: Secondary | ICD-10-CM

## 2024-12-27 NOTE — Progress Notes (Signed)
 OFFICE VISIT  12/27/2024  CC:  Chief Complaint  Patient presents with   Rash    Located across torso/adb area  and back; onset 2.5 months ago. Has tried cortisone 10 cream and Zyrtec.    Patient is a 84 y.o. male who presents for rash.  HPI: Approximately 3 months ago he noticed some pink oval plaques start to arise on his chest, side, and back.  These do not itch or hurt.  He otherwise feels well. No known preceding viral illness.  He has no fever/chills, no malaise, no abnormal weight loss. He has no joint aches, muscle aches, headaches, oral ulcers, or eye redness.  Past Medical History:  Diagnosis Date   Cataract removed Nov 2023   Colon polyps    Repeat TCS on 09/04/2014 showed no polyps and GI MD recommended NO FURTHER COLONOSCOPIES   Glaucoma Nov 2023   stents implanted to resolve   Hyperkalemia    ? secondary to ACE-I?  Dose decreased by 50% 02/22/16 and repeat K was in normal range.    Hyperlipidemia    Hypertension    IFG (impaired fasting glucose) 11/01/2019   gluc 110. HbAc 5.5%. A1c 5.7% 11/2020.   Increased prostate specific antigen (PSA) velocity    2018: on recheck 6 mo later PSA returned to his baseline (<2.0).  Repeat 02/2018 stable.     Nonmelanoma skin cancer 2009   sees skin MD annually   Sensorineural hearing loss (SNHL) of both ears 2018   [facility]--hearing aids.   Tinnitus aurium, left    + chronic bilat hearing loss    Past Surgical History:  Procedure Laterality Date   CATARACT EXTRACTION Left 10/06/2022   CATARACT EXTRACTION Right 10/20/2022   COLONOSCOPY  10/12/12015   mild diverticulosis.  No polyps.  No further screening colonoscopies recommended ([facility])   COLONOSCOPY W/ POLYPECTOMY  06/2009   adenomatous polyps; recall 06/2014 ([facility] [place])   EYE SURGERY  Nov 2023   cataracts removed both eyes   INGUINAL HERNIA REPAIR Left 2001   TONSILLECTOMY     US  CAROTID DOPPLER BILATERAL ([facility]  [facility])  10/26/2019   No signif stenosis on either side.  vert's patent with antegrade flow    Outpatient Medications Prior to Visit  Medication Sig Dispense Refill   ASPIRIN 81 PO Take 81 mg by mouth daily.     hydrochlorothiazide  (MICROZIDE ) 12.5 MG capsule Take 1 capsule (12.5 mg total) by mouth daily. 90 capsule 3   latanoprost (XALATAN) 0.005 % ophthalmic solution Place 1 drop into both eyes at bedtime.     rosuvastatin  (CRESTOR ) 5 MG tablet Take 1 tablet (5 mg total) by mouth daily. 90 tablet 3   verapamil  (CALAN -SR) 240 MG CR tablet Take 1 tablet (240 mg total) by mouth daily. 90 tablet 3   No facility-administered medications prior to visit.    Allergies[1]  Review of Systems  As per HPI  PE:    12/27/2024   11:08 AM 12/27/2024   11:04 AM 09/15/2024    9:37 AM  Vitals with BMI  Height  5' 9.5   Weight  168 lbs   BMI  24.46   Systolic 147 160 856  Diastolic 67 75 76  Pulse  71      Physical Exam  Gen: Alert, well appearing.  Patient is oriented to person, place, time, and situation. AFFECT: pleasant, lucid thought and speech. Skin: The back has multiple scattered pinkish oval plaques  with fine scaling, generally following the lines of cleavage and a Christmas tree distribution. They are scattered pretty symmetrically into the flanks bilaterally.  He has several lesions on the chest.  These lesions do blanch. He has no petechiae or ecchymoses, no vesicles or pustules. The lesions are confined to the trunk.  LABS:  Last CBC Lab Results  Component Value Date   WBC 6.9 09/15/2024   HGB 15.5 09/15/2024   HCT 46.7 09/15/2024   MCV 95.5 09/15/2024   MCH 31.7 09/15/2024   RDW 12.8 09/15/2024   PLT 272 09/15/2024   Last metabolic panel Lab Results  Component Value Date   GLUCOSE 103 (H) 09/15/2024   NA 141 09/15/2024   K 4.7 09/15/2024   CL 103 09/15/2024   CO2 30 09/15/2024   BUN 21 09/15/2024   CREATININE 1.15 09/15/2024   EGFR 62 09/15/2024   CALCIUM   9.5 09/15/2024   PROT 6.8 09/15/2024   ALBUMIN 4.2 07/17/2022   BILITOT 0.6 09/15/2024   ALKPHOS 65 07/17/2022   AST 16 09/15/2024   ALT 13 09/15/2024   Lab Results  Component Value Date   HGBA1C 5.6 09/15/2024   IMPRESSION AND PLAN:  Rash most consistent with pityriasis rosea. This has been present longer than usual for this condition, though. Discussed the option of skin biopsy, see the dermatologist, or both. Through shared decision-making process today we decided to give this another month and then if still present he would see the dermatologist.  He is already established with one.  An After Visit Summary was printed and given to the patient.  FOLLOW UP: Return for as needed.  Signed:  Phil Cougar Imel, MD           12/27/2024      [1] No Known Allergies

## 2024-12-30 ENCOUNTER — Ambulatory Visit: Admitting: Family Medicine

## 2025-03-16 ENCOUNTER — Ambulatory Visit: Admitting: Family Medicine
